# Patient Record
Sex: Male | Born: 1960 | Race: White | Hispanic: No | Marital: Married | Smoking: Never smoker
Health system: Southern US, Community
[De-identification: ages and names within clinical notes are randomized; demographics above are authoritative.]

## PROBLEM LIST (undated history)

## (undated) DIAGNOSIS — Z86010 Personal history of colonic polyps: Secondary | ICD-10-CM

## (undated) DIAGNOSIS — T7840XA Allergy, unspecified, initial encounter: Secondary | ICD-10-CM

## (undated) DIAGNOSIS — M65342 Trigger finger, left ring finger: Secondary | ICD-10-CM

## (undated) DIAGNOSIS — F988 Other specified behavioral and emotional disorders with onset usually occurring in childhood and adolescence: Secondary | ICD-10-CM

## (undated) DIAGNOSIS — F329 Major depressive disorder, single episode, unspecified: Secondary | ICD-10-CM

## (undated) DIAGNOSIS — M1990 Unspecified osteoarthritis, unspecified site: Secondary | ICD-10-CM

## (undated) DIAGNOSIS — Z87442 Personal history of urinary calculi: Secondary | ICD-10-CM

## (undated) DIAGNOSIS — J45909 Unspecified asthma, uncomplicated: Secondary | ICD-10-CM

## (undated) DIAGNOSIS — Z98811 Dental restoration status: Secondary | ICD-10-CM

## (undated) DIAGNOSIS — J302 Other seasonal allergic rhinitis: Secondary | ICD-10-CM

## (undated) DIAGNOSIS — I10 Essential (primary) hypertension: Secondary | ICD-10-CM

## (undated) DIAGNOSIS — M2560 Stiffness of unspecified joint, not elsewhere classified: Secondary | ICD-10-CM

## (undated) DIAGNOSIS — Z8709 Personal history of other diseases of the respiratory system: Secondary | ICD-10-CM

## (undated) DIAGNOSIS — F32A Depression, unspecified: Secondary | ICD-10-CM

## (undated) DIAGNOSIS — Z8719 Personal history of other diseases of the digestive system: Secondary | ICD-10-CM

## (undated) HISTORY — DX: Major depressive disorder, single episode, unspecified: F32.9

## (undated) HISTORY — DX: Unspecified osteoarthritis, unspecified site: M19.90

## (undated) HISTORY — DX: Depression, unspecified: F32.A

## (undated) HISTORY — DX: Other specified behavioral and emotional disorders with onset usually occurring in childhood and adolescence: F98.8

## (undated) HISTORY — PX: PHOTOREFRACTIVE KERATOTOMY: SHX216

## (undated) HISTORY — DX: Personal history of colonic polyps: Z86.010

## (undated) HISTORY — PX: CHOLECYSTECTOMY: SHX55

## (undated) HISTORY — DX: Essential (primary) hypertension: I10

## (undated) HISTORY — DX: Unspecified asthma, uncomplicated: J45.909

## (undated) HISTORY — DX: Allergy, unspecified, initial encounter: T78.40XA

---

## 2010-08-14 ENCOUNTER — Emergency Department (HOSPITAL_COMMUNITY): Admission: EM | Admit: 2010-08-14 | Discharge: 2010-08-14 | Payer: Self-pay | Admitting: Family Medicine

## 2010-11-08 DIAGNOSIS — M1990 Unspecified osteoarthritis, unspecified site: Secondary | ICD-10-CM

## 2010-11-08 HISTORY — DX: Unspecified osteoarthritis, unspecified site: M19.90

## 2011-01-22 ENCOUNTER — Ambulatory Visit
Admission: RE | Admit: 2011-01-22 | Discharge: 2011-01-22 | Disposition: A | Discharge status: Home / Self Care | Payer: 59 | Source: Ambulatory Visit | Attending: Allergy | Admitting: Allergy

## 2011-01-22 ENCOUNTER — Other Ambulatory Visit: Payer: Self-pay | Admitting: Allergy

## 2011-01-22 DIAGNOSIS — J329 Chronic sinusitis, unspecified: Secondary | ICD-10-CM

## 2011-02-19 ENCOUNTER — Encounter (HOSPITAL_BASED_OUTPATIENT_CLINIC_OR_DEPARTMENT_OTHER)
Admission: RE | Admit: 2011-02-19 | Discharge: 2011-02-19 | Disposition: A | Discharge status: Home / Self Care | Payer: 59 | Source: Ambulatory Visit | Attending: Orthopedic Surgery | Admitting: Orthopedic Surgery

## 2011-02-22 ENCOUNTER — Ambulatory Visit (HOSPITAL_BASED_OUTPATIENT_CLINIC_OR_DEPARTMENT_OTHER)
Admission: RE | Admit: 2011-02-22 | Discharge: 2011-02-22 | Disposition: A | Discharge status: Home / Self Care | Payer: 59 | Source: Ambulatory Visit | Attending: Orthopedic Surgery | Admitting: Orthopedic Surgery

## 2011-02-22 DIAGNOSIS — J45909 Unspecified asthma, uncomplicated: Secondary | ICD-10-CM | POA: Insufficient documentation

## 2011-02-22 DIAGNOSIS — Z01810 Encounter for preprocedural cardiovascular examination: Secondary | ICD-10-CM | POA: Insufficient documentation

## 2011-02-22 DIAGNOSIS — F329 Major depressive disorder, single episode, unspecified: Secondary | ICD-10-CM | POA: Insufficient documentation

## 2011-02-22 DIAGNOSIS — M6530 Trigger finger, unspecified finger: Secondary | ICD-10-CM | POA: Insufficient documentation

## 2011-02-22 DIAGNOSIS — Z01812 Encounter for preprocedural laboratory examination: Secondary | ICD-10-CM | POA: Insufficient documentation

## 2011-02-22 DIAGNOSIS — I10 Essential (primary) hypertension: Secondary | ICD-10-CM | POA: Insufficient documentation

## 2011-02-22 DIAGNOSIS — G479 Sleep disorder, unspecified: Secondary | ICD-10-CM | POA: Insufficient documentation

## 2011-02-22 DIAGNOSIS — Z79899 Other long term (current) drug therapy: Secondary | ICD-10-CM | POA: Insufficient documentation

## 2011-02-22 DIAGNOSIS — R51 Headache: Secondary | ICD-10-CM | POA: Insufficient documentation

## 2011-02-22 DIAGNOSIS — F3289 Other specified depressive episodes: Secondary | ICD-10-CM | POA: Insufficient documentation

## 2011-02-22 HISTORY — PX: TRIGGER FINGER RELEASE: SHX641

## 2011-02-25 NOTE — Op Note (Signed)
Christopher Bolton, Christopher Bolton             ACCOUNT NO.:  [id number]  MEDICAL RECORD NO.:  [id number]           PATIENT TYPE:  LOCATION:                                 FACILITY:  PHYSICIAN:  Betha Loa, MD             DATE OF BIRTH:  DATE OF PROCEDURE:  02/22/2011 DATE OF DISCHARGE:                              OPERATIVE REPORT   PREOPERATIVE DIAGNOSIS:  Right long finger trigger digit.  POSTOPERATIVE DIAGNOSIS:  Right long finger trigger digit.  PROCEDURE:  Release of A1 pulley, right long finger.  SURGEON:  Betha Loa, MD.  ASSISTANT:  None.  ANESTHESIA:  Local with sedation.  IV FLUIDS:  Per anesthesia flow sheet.  ESTIMATED BLOOD LOSS:  Minimal.  COMPLICATIONS:  None.  SPECIMENS:  None.  TOURNIQUET TIME:  25 minutes.  DISPOSITION:  Stable to PACU.  INDICATIONS:  Christopher Bolton is a 50 year old male who had been following in the office for right long finger trigger digit.  We have injected this three times over the past year.  He has had relief with the injections, but the triggering continues to recur.  He most recently noted it start to catch in the morning.  He returned to the office and we discussed release of the A1 pulley.  Risks, benefits, and alternatives of surgery were discussed including the risk of blood loss, infection, damage to nerves, vessels, tendons, ligaments bone, fair surgery, need for additional surgery, complications with wound healing, continued pain, continued triggering, and need for repeat release.  They voiced these risks and elected to proceed.  OPERATIVE COURSE:  After being identified preoperatively by myself, the patient and I agreed upon the procedure and site of procedure.  The surgical site was marked.  The risks, benefits, and alternatives of surgery were reviewed and he wished to proceed.  Surgical consent was signed.  He was given 1 gram of IV Ancef as preoperative antibiotic prophylaxis.  He was transported to the operating room,  placed on the operating table in supine position with the right upper extremity on armboard.  Sedation was administered by the anesthesiologist.  The right upper extremity was prepped and draped in normal sterile orthopedic fashion.  Surgical pause was performed between surgeons, Anesthesia, and operating room staff, and all were in agreement as to the patient, procedure, and site of procedure.  The area of the A1 pulley was injected with a half-and-half solution of 1% plain lidocaine and 0.25% plain Marcaine.  Tourniquet at the proximal aspect of the forearm was inflated to 250 mmHg after exsanguination of limb with an Esmarch bandage.  Incision was made in oblique fashion between the proximal and distal palmar creases.  This was carried into subcutaneous tissues by spreading technique.  Care was taken to protect the neurovascular bundles on both the radial and ulnar sides.  The flexor tendon sheath was identified.  The A1 pulley was incised its entirety.  Good division between the A1 and A2 pulley was seen.  There was some tight bands oftissue more proximally and these were divided as well.  The tendons were expressed through the  wound and separated.  The patient was awoken from his sedation.  He was asked to make a tight fist, which he was able to do, and then slowly extend the fingers and no catching was noted.  He was able to fully flex and fully extend the digits.  The wound was copiously irrigated with 500 mL of sterile saline.  It was then closed with 4-0 nylon in a horizontal mattress fashion.  The wound was dressed with sterile Xeroform, 4x4s, and wrapped with Kling and Coban dressing. The tourniquet was deflated at 25 minutes.  The operative drapes were broken down.  The patient was awoken from anesthesia safely.  He was transferred back to stretcher and taken to PACU in stable condition.  I will see him back in the office in 1 week.  I will give him Percocet 5/325 one to two  p.o. q.6 h. p.r.n. pain, dispensed #40.     Betha Loa, MD     KK/MEDQ  D:  02/22/2011  T:  02/22/2011  Job:  [id number]  Electronically Signed by Betha Loa  on 02/25/2011 05:58:26 PM

## 2011-09-13 ENCOUNTER — Other Ambulatory Visit: Payer: Self-pay | Admitting: Emergency Medicine

## 2011-09-13 DIAGNOSIS — M542 Cervicalgia: Secondary | ICD-10-CM

## 2011-09-16 ENCOUNTER — Ambulatory Visit
Admission: RE | Admit: 2011-09-16 | Discharge: 2011-09-16 | Disposition: A | Discharge status: Home / Self Care | Payer: 59 | Source: Ambulatory Visit | Attending: Emergency Medicine | Admitting: Emergency Medicine

## 2011-09-16 DIAGNOSIS — M542 Cervicalgia: Secondary | ICD-10-CM

## 2011-10-21 ENCOUNTER — Ambulatory Visit (INDEPENDENT_AMBULATORY_CARE_PROVIDER_SITE_OTHER): Payer: 59

## 2011-10-21 DIAGNOSIS — M5030 Other cervical disc degeneration, unspecified cervical region: Secondary | ICD-10-CM

## 2012-01-26 ENCOUNTER — Ambulatory Visit (INDEPENDENT_AMBULATORY_CARE_PROVIDER_SITE_OTHER): Payer: 59 | Admitting: Emergency Medicine

## 2012-01-26 VITALS — BP 150/93 | HR 74 | Temp 98.6°F | Resp 18 | Ht 67.5 in | Wt 219.0 lb

## 2012-01-26 DIAGNOSIS — Z9109 Other allergy status, other than to drugs and biological substances: Secondary | ICD-10-CM

## 2012-01-26 DIAGNOSIS — J309 Allergic rhinitis, unspecified: Secondary | ICD-10-CM

## 2012-01-26 DIAGNOSIS — M542 Cervicalgia: Secondary | ICD-10-CM

## 2012-01-26 MED ORDER — GABAPENTIN 100 MG PO CAPS: ORAL_CAPSULE | ORAL | Status: DC

## 2012-01-26 MED ORDER — MONTELUKAST SODIUM 10 MG PO TABS: 10.0000 mg | ORAL_TABLET | Freq: Every day | ORAL | Status: DC

## 2012-01-26 MED ORDER — HYDROCODONE-ACETAMINOPHEN 5-325 MG PO TABS: 1.0000 | ORAL_TABLET | Freq: Four times a day (QID) | ORAL | Status: AC | PRN

## 2012-01-26 MED ORDER — ALBUTEROL SULFATE HFA 108 (90 BASE) MCG/ACT IN AERS: 2.0000 | INHALATION_SPRAY | RESPIRATORY_TRACT | Status: DC | PRN

## 2012-01-26 MED ORDER — METHYLPREDNISOLONE ACETATE 80 MG/ML IJ SUSP
80.0000 mg | Freq: Once | INTRAMUSCULAR | Status: AC
  Administered 2012-01-26: 80 mg via INTRAMUSCULAR

## 2012-01-26 MED ORDER — LEVOCETIRIZINE DIHYDROCHLORIDE 5 MG PO TABS: 5.0000 mg | ORAL_TABLET | Freq: Every evening | ORAL | Status: DC

## 2012-01-26 NOTE — Progress Notes (Signed)
  Subjective:    Patient ID: Christopher Bolton, male    DOB: [birth year], 51 y.o.   MRN: [id number]  HPI patient enters for followup of his cervical disease. His main level of problem and C5-C6 where he has significant foraminal stenosis which is causing some narrowing of the spinal canal. He's been seeing Dr. Melvyn Neth a chiropractor and receiving cervical traction. Does seem to help some. Pain and requires hydrocodone as also been on gabapentin and taking that off and on at night. Because it gives him bad dreams didn't promise of seasonal allergies. He is to see his doctor in detail and get a shot of Depo-Medrol every month. I told her that that was not a policy was followed in our office. At [facility] to get a shot    Review of Systems  Constitutional: Negative.   HENT: Positive for congestion, rhinorrhea, sneezing and postnasal drip.   Eyes: Positive for itching.  Respiratory: Positive for wheezing.   Gastrointestinal: Negative.   Genitourinary: Negative.   Musculoskeletal: Positive for back pain.       Persistent having a lot of discomfort in his neck. His low pain into shoulders and both arms. He has no numbness in his hands.  Skin: Negative.        Objective:   Physical Exam HEENT exam TMs are clear the nose is congested throat is normal neck supple chest no wheezes were heard T10 reflexes of the upper extremities are 1+. Motor strength was 5 out of 5 all muscle groups in the arms. He has very limited range of motion of his neck. He does have pain with axial loading.        Assessment & Plan:  Patient has significant cervical disc disease. He also has seasonal allergic rhinitis. Plan refill his medications he normally takes for this. He will continue on Neurontin at night. I did give him a limited supply of hydrocodone for severe pain. He is still not interested in a neurosurgical opinion about his neck.

## 2012-08-01 ENCOUNTER — Other Ambulatory Visit: Payer: Self-pay | Admitting: Family Medicine

## 2012-10-02 ENCOUNTER — Ambulatory Visit (INDEPENDENT_AMBULATORY_CARE_PROVIDER_SITE_OTHER): Payer: 59 | Admitting: Physician Assistant

## 2012-10-02 VITALS — BP 157/98 | HR 86 | Temp 98.0°F | Resp 18 | Ht 68.0 in | Wt 221.0 lb

## 2012-10-02 DIAGNOSIS — Z131 Encounter for screening for diabetes mellitus: Secondary | ICD-10-CM

## 2012-10-02 DIAGNOSIS — J329 Chronic sinusitis, unspecified: Secondary | ICD-10-CM

## 2012-10-02 DIAGNOSIS — I10 Essential (primary) hypertension: Secondary | ICD-10-CM

## 2012-10-02 LAB — GLUCOSE, POCT (MANUAL RESULT ENTRY): POC Glucose: 51 mg/dl — AB (ref 70–99)

## 2012-10-02 MED ORDER — PREDNISONE 20 MG PO TABS: ORAL_TABLET | ORAL | Status: DC

## 2012-10-02 MED ORDER — AZITHROMYCIN 250 MG PO TABS: ORAL_TABLET | ORAL | Status: DC

## 2012-10-02 MED ORDER — LOSARTAN POTASSIUM 50 MG PO TABS: 50.0000 mg | ORAL_TABLET | Freq: Every day | ORAL | Status: DC

## 2012-10-02 MED ORDER — FLUTICASONE PROPIONATE 50 MCG/ACT NA SUSP: 2.0000 | Freq: Every day | NASAL | Status: DC

## 2012-10-02 NOTE — Progress Notes (Signed)
  Subjective:    Patient ID: Christopher Bolton, male    DOB: 08/12/1961, 51 y.o.   MRN: [id number]  HPI 51 year old male presents with 4 day history of sinus pressure/pain, left sided otalgia, congestion, and sore throat. History of sinus infections treated with azithromycin and sometimes prednisone . Does have seasonal allergies in the Spring treated with Xyzal and Singulair.  States he never has problems with allergies this time of year. Denies fever, chills, nausea, vomiting, dizziness, or cough.  Also has been on Cozaar for hypertension which he has been out of due to needing an office visit.  It is slightly elevated today.  He has tolerated it well.      Review of Systems  Constitutional: Negative for fever and chills.  HENT: Positive for ear pain (left side), congestion, sore throat, postnasal drip and sinus pressure. Negative for rhinorrhea.   Respiratory: Negative for cough.   Gastrointestinal: Negative for nausea and vomiting.  All other systems reviewed and are negative.       Objective:   Physical Exam  Constitutional: He is oriented to person, place, and time. He appears well-developed and well-nourished.  HENT:  Head: Normocephalic and atraumatic.  Right Ear: Hearing, tympanic membrane, external ear and ear canal normal.  Left Ear: Hearing, external ear and ear canal normal. Tympanic membrane is erythematous.  Nose: Right sinus exhibits maxillary sinus tenderness. Right sinus exhibits no frontal sinus tenderness. Left sinus exhibits maxillary sinus tenderness. Left sinus exhibits no frontal sinus tenderness.  Mouth/Throat: Oropharynx is clear and moist. No oropharyngeal exudate.  Eyes: Conjunctivae normal are normal.  Neck: Normal range of motion.  Cardiovascular: Normal rate, regular rhythm and normal heart sounds.   Pulmonary/Chest: Effort normal and breath sounds normal.  Lymphadenopathy:    He has no cervical adenopathy.  Neurological: He is alert and oriented to person,  place, and time.  Psychiatric: He has a normal mood and affect. His behavior is normal. Judgment and thought content normal.    Results for orders placed in visit on 10/02/12  GLUCOSE, POCT (MANUAL RESULT ENTRY)      Component Value Range   POC Glucose 51 (*) 70 - 99 mg/dl         Assessment & Plan:   1. Sinusitis  azithromycin (ZITHROMAX) 250 MG tablet, fluticasone (FLONASE) 50 MCG/ACT nasal spray  2. Screening for diabetes mellitus  POCT glucose (manual entry)  3. Hypertension  Comprehensive metabolic panel   Zpack use as directed Increase fluids Flonase bid Follow up if symptoms worsen or fail to improve Refilled Cozaar #90 with plans for him to see Dr. Cleta Alberts for CPE before this runs out. I have told him that I would rather refill this x 1 then for him to run out.  He understands this and plans to come in the next 3 months for a physical.

## 2012-10-03 ENCOUNTER — Encounter: Payer: Self-pay | Admitting: Physician Assistant

## 2012-10-03 LAB — COMPREHENSIVE METABOLIC PANEL
ALT: 22 U/L (ref 0–53)
AST: 16 U/L (ref 0–37)
Albumin: 4.8 g/dL (ref 3.5–5.2)
Alkaline Phosphatase: 99 U/L (ref 39–117)
BUN: 9 mg/dL (ref 6–23)
CO2: 28 mEq/L (ref 19–32)
Calcium: 9.3 mg/dL (ref 8.4–10.5)
Chloride: 104 mEq/L (ref 96–112)
Creat: 0.99 mg/dL (ref 0.50–1.35)
Glucose, Bld: 54 mg/dL — ABNORMAL LOW (ref 70–99)
Potassium: 4.1 mEq/L (ref 3.5–5.3)
Sodium: 143 mEq/L (ref 135–145)
Total Bilirubin: 1 mg/dL (ref 0.3–1.2)
Total Protein: 6.8 g/dL (ref 6.0–8.3)

## 2012-10-04 ENCOUNTER — Ambulatory Visit: Payer: 59

## 2012-10-04 ENCOUNTER — Ambulatory Visit (INDEPENDENT_AMBULATORY_CARE_PROVIDER_SITE_OTHER): Payer: 59 | Admitting: Family Medicine

## 2012-10-04 VITALS — BP 162/100 | HR 74 | Temp 97.6°F | Resp 18 | Ht 68.0 in | Wt 221.4 lb

## 2012-10-04 DIAGNOSIS — R739 Hyperglycemia, unspecified: Secondary | ICD-10-CM

## 2012-10-04 DIAGNOSIS — R81 Glycosuria: Secondary | ICD-10-CM

## 2012-10-04 DIAGNOSIS — R110 Nausea: Secondary | ICD-10-CM

## 2012-10-04 DIAGNOSIS — R198 Other specified symptoms and signs involving the digestive system and abdomen: Secondary | ICD-10-CM

## 2012-10-04 DIAGNOSIS — R109 Unspecified abdominal pain: Secondary | ICD-10-CM

## 2012-10-04 DIAGNOSIS — R7309 Other abnormal glucose: Secondary | ICD-10-CM

## 2012-10-04 DIAGNOSIS — N201 Calculus of ureter: Secondary | ICD-10-CM

## 2012-10-04 LAB — POCT UA - MICROSCOPIC ONLY
Mucus, UA: NEGATIVE
Yeast, UA: NEGATIVE

## 2012-10-04 LAB — POCT URINALYSIS DIPSTICK
Bilirubin, UA: NEGATIVE
Glucose, UA: 500
Ketones, UA: NEGATIVE
Leukocytes, UA: NEGATIVE
Nitrite, UA: NEGATIVE

## 2012-10-04 MED ORDER — TAMSULOSIN HCL 0.4 MG PO CAPS: 0.4000 mg | ORAL_CAPSULE | Freq: Every day | ORAL | Status: DC

## 2012-10-04 MED ORDER — OXYCODONE-ACETAMINOPHEN 7.5-325 MG PO TABS: 1.0000 | ORAL_TABLET | ORAL | Status: DC | PRN

## 2012-10-04 MED ORDER — ONDANSETRON 8 MG PO TBDP: 8.0000 mg | ORAL_TABLET | Freq: Three times a day (TID) | ORAL | Status: DC | PRN

## 2012-10-04 MED ORDER — KETOROLAC TROMETHAMINE 60 MG/2ML IM SOLN
60.0000 mg | Freq: Once | INTRAMUSCULAR | Status: AC
  Administered 2012-10-04: 60 mg via INTRAMUSCULAR

## 2012-10-04 MED ORDER — ONDANSETRON 4 MG PO TBDP: 4.0000 mg | ORAL_TABLET | Freq: Three times a day (TID) | ORAL | Status: DC | PRN

## 2012-10-04 MED ORDER — ONDANSETRON 4 MG PO TBDP: 8.0000 mg | ORAL_TABLET | Freq: Once | ORAL | Status: DC

## 2012-10-04 NOTE — Patient Instructions (Addendum)
Drink plenty of fluids  Strain urine  Take pain pills when needed every 4 hours for the pain  Take the Flomax every night to try and relax the ureter to help to pass the stone  If the stone does not pass over the next 5 days return for reevaluation

## 2012-10-04 NOTE — Progress Notes (Signed)
Subjective: Patient has had some pain in his back last couple of days, but today had acute severe left flank pain. He has had intermittent discoloration of his urine making think he has had some hematuria. He was just here 2 days ago for treating his sinuses. He has had kidney stones in the past, and is fairly sure this is a kidney stone with severe left flank pain  Objective: Very tender to percussion on the left CVA region. Left abdomen is tender in the left lower quadrant mid abdomen area.   Results for orders placed in visit on 10/04/12  POCT UA - MICROSCOPIC ONLY      Component Value Range   WBC, Ur, HPF, POC 0-1     RBC, urine, microscopic 0-10     Bacteria, U Microscopic neg     Mucus, UA neg     Epithelial cells, urine per micros 0-3     Crystals, Ur, HPF, POC neg     Casts, Ur, LPF, POC calcium oxalate     Yeast, UA neg    POCT URINALYSIS DIPSTICK      Component Value Range   Color, UA yellow     Clarity, UA clear     Glucose, UA 500     Bilirubin, UA neg     Ketones, UA neg     Spec Grav, UA 1.020     Blood, UA large     pH, UA 7.0     Protein, UA neg     Urobilinogen, UA 0.2     Nitrite, UA neg     Leukocytes, UA Negative    GLUCOSE, POCT (MANUAL RESULT ENTRY)      Component Value Range   POC Glucose 208 (*) 70 - 99 mg/dl   [facility] reading (PRIMARY) by  Dr. Alwyn Ren Problem left ureterolithiasis at about the lumbar 3-4 interspace area.  Assessment: Left ureterolithiasis and flank pain  Plan: Toradol and Zofran given in the office. Percocet 7.5/325 #25 one to 2 every 4 when necessary for pain  Return if he does not pass the stone. He is to strain his urine.

## 2012-10-08 ENCOUNTER — Emergency Department (HOSPITAL_COMMUNITY): Payer: 59

## 2012-10-08 ENCOUNTER — Encounter (HOSPITAL_COMMUNITY): Payer: Self-pay | Admitting: Anesthesiology

## 2012-10-08 ENCOUNTER — Encounter (HOSPITAL_COMMUNITY)
Admission: EM | Disposition: A | Discharge status: Home / Self Care | Payer: Self-pay | Source: Home / Self Care | Attending: Emergency Medicine

## 2012-10-08 ENCOUNTER — Observation Stay (HOSPITAL_COMMUNITY): Payer: 59 | Admitting: Anesthesiology

## 2012-10-08 ENCOUNTER — Encounter (HOSPITAL_COMMUNITY): Payer: Self-pay | Admitting: Emergency Medicine

## 2012-10-08 ENCOUNTER — Observation Stay (HOSPITAL_COMMUNITY)
Admission: EM | Admit: 2012-10-08 | Discharge: 2012-10-09 | Disposition: A | Discharge status: Home / Self Care | Payer: 59 | Attending: Urology | Admitting: Urology

## 2012-10-08 DIAGNOSIS — N132 Hydronephrosis with renal and ureteral calculous obstruction: Secondary | ICD-10-CM

## 2012-10-08 DIAGNOSIS — I10 Essential (primary) hypertension: Secondary | ICD-10-CM | POA: Insufficient documentation

## 2012-10-08 DIAGNOSIS — N201 Calculus of ureter: Principal | ICD-10-CM | POA: Insufficient documentation

## 2012-10-08 DIAGNOSIS — N289 Disorder of kidney and ureter, unspecified: Secondary | ICD-10-CM

## 2012-10-08 DIAGNOSIS — N179 Acute kidney failure, unspecified: Secondary | ICD-10-CM | POA: Insufficient documentation

## 2012-10-08 HISTORY — PX: CYSTOSCOPY/RETROGRADE/URETEROSCOPY: SHX5316

## 2012-10-08 LAB — BASIC METABOLIC PANEL
BUN: 21 mg/dL (ref 6–23)
Chloride: 95 mEq/L — ABNORMAL LOW (ref 96–112)
GFR calc Af Amer: 55 mL/min — ABNORMAL LOW (ref >90)
GFR calc non Af Amer: 48 mL/min — ABNORMAL LOW (ref >90)
Potassium: 3.9 mEq/L (ref 3.5–5.1)

## 2012-10-08 LAB — URINALYSIS, ROUTINE W REFLEX MICROSCOPIC
Glucose, UA: NEGATIVE mg/dL
Ketones, ur: 15 mg/dL — AB
Leukocytes, UA: NEGATIVE
pH: 5 (ref 5.0–8.0)

## 2012-10-08 LAB — SURGICAL PCR SCREEN
MRSA, PCR: NEGATIVE
Staphylococcus aureus: NEGATIVE

## 2012-10-08 SURGERY — CYSTOSCOPY/RETROGRADE/URETEROSCOPY
Anesthesia: General | Site: Ureter | Laterality: Left | Wound class: Clean Contaminated

## 2012-10-08 MED ORDER — MONTELUKAST SODIUM 10 MG PO TABS
10.0000 mg | ORAL_TABLET | Freq: Every day | ORAL | Status: DC
  Administered 2012-10-09: 10 mg via ORAL
  Filled 2012-10-08 (×2): qty 1

## 2012-10-08 MED ORDER — LIDOCAINE HCL 1 % IJ SOLN
INTRAMUSCULAR | Status: DC | PRN
  Administered 2012-10-08: 60 mg via INTRADERMAL

## 2012-10-08 MED ORDER — KCL IN DEXTROSE-NACL 10-5-0.45 MEQ/L-%-% IV SOLN
INTRAVENOUS | Status: DC
  Administered 2012-10-08 – 2012-10-09 (×2): via INTRAVENOUS
  Filled 2012-10-08 (×2): qty 1000

## 2012-10-08 MED ORDER — LACTATED RINGERS IV SOLN
INTRAVENOUS | Status: DC
  Administered 2012-10-09: via INTRAVENOUS

## 2012-10-08 MED ORDER — IOHEXOL 300 MG/ML  SOLN
INTRAMUSCULAR | Status: DC | PRN
  Administered 2012-10-08: 50 mL

## 2012-10-08 MED ORDER — ACETAMINOPHEN 10 MG/ML IV SOLN
INTRAVENOUS | Status: DC | PRN
  Administered 2012-10-08 (×2): 1000 mg via INTRAVENOUS

## 2012-10-08 MED ORDER — PROPOFOL 10 MG/ML IV EMUL
INTRAVENOUS | Status: DC | PRN
  Administered 2012-10-08: 200 mg via INTRAVENOUS

## 2012-10-08 MED ORDER — OXYCODONE-ACETAMINOPHEN 5-325 MG PO TABS
1.0000 | ORAL_TABLET | ORAL | Status: DC | PRN
  Administered 2012-10-09: 1 via ORAL
  Filled 2012-10-08: qty 1

## 2012-10-08 MED ORDER — HYDROMORPHONE HCL PF 1 MG/ML IJ SOLN
0.5000 mg | INTRAMUSCULAR | Status: DC | PRN
  Administered 2012-10-08: 1 mg via INTRAVENOUS
  Filled 2012-10-08: qty 1

## 2012-10-08 MED ORDER — SODIUM CHLORIDE 0.9 % IV BOLUS (SEPSIS)
1000.0000 mL | Freq: Once | INTRAVENOUS | Status: AC
  Administered 2012-10-08: 1000 mL via INTRAVENOUS

## 2012-10-08 MED ORDER — FENTANYL CITRATE 0.05 MG/ML IJ SOLN
INTRAMUSCULAR | Status: DC | PRN
  Administered 2012-10-08: 50 ug via INTRAVENOUS
  Administered 2012-10-08: 25 ug via INTRAVENOUS

## 2012-10-08 MED ORDER — DOCUSATE SODIUM 100 MG PO CAPS
100.0000 mg | ORAL_CAPSULE | Freq: Two times a day (BID) | ORAL | Status: DC
  Administered 2012-10-09: 100 mg via ORAL
  Filled 2012-10-08 (×3): qty 1

## 2012-10-08 MED ORDER — ONDANSETRON HCL 4 MG/2ML IJ SOLN: 4.0000 mg | INTRAMUSCULAR | Status: DC | PRN

## 2012-10-08 MED ORDER — BUPROPION HCL ER (XL) 300 MG PO TB24
450.0000 mg | ORAL_TABLET | Freq: Every day | ORAL | Status: DC
  Filled 2012-10-08 (×2): qty 1

## 2012-10-08 MED ORDER — HYDROMORPHONE HCL PF 1 MG/ML IJ SOLN
1.0000 mg | Freq: Once | INTRAMUSCULAR | Status: AC
  Administered 2012-10-08: 1 mg via INTRAVENOUS
  Filled 2012-10-08: qty 1

## 2012-10-08 MED ORDER — SENNA 8.6 MG PO TABS
1.0000 | ORAL_TABLET | Freq: Two times a day (BID) | ORAL | Status: DC
  Administered 2012-10-09: 8.6 mg via ORAL
  Filled 2012-10-08: qty 1

## 2012-10-08 MED ORDER — CEFAZOLIN SODIUM-DEXTROSE 2-3 GM-% IV SOLR
INTRAVENOUS | Status: DC | PRN
  Administered 2012-10-08: 2 g via INTRAVENOUS

## 2012-10-08 MED ORDER — TAMSULOSIN HCL 0.4 MG PO CAPS
0.4000 mg | ORAL_CAPSULE | Freq: Every day | ORAL | Status: DC
  Filled 2012-10-08: qty 1

## 2012-10-08 MED ORDER — SENNA-DOCUSATE SODIUM 8.6-50 MG PO TABS: 1.0000 | ORAL_TABLET | Freq: Two times a day (BID) | ORAL | Status: DC

## 2012-10-08 MED ORDER — HYDROMORPHONE HCL PF 1 MG/ML IJ SOLN: 0.2500 mg | INTRAMUSCULAR | Status: DC | PRN

## 2012-10-08 MED ORDER — SODIUM CHLORIDE 0.9 % IR SOLN
Status: DC | PRN
  Administered 2012-10-08: 4000 mL

## 2012-10-08 MED ORDER — CEFAZOLIN SODIUM-DEXTROSE 2-3 GM-% IV SOLR
INTRAVENOUS | Status: AC
  Filled 2012-10-08: qty 50

## 2012-10-08 MED ORDER — OXYBUTYNIN CHLORIDE 5 MG PO TABS: 5.0000 mg | ORAL_TABLET | Freq: Three times a day (TID) | ORAL | Status: DC | PRN

## 2012-10-08 MED ORDER — OXYCODONE-ACETAMINOPHEN 5-325 MG PO TABS: 1.0000 | ORAL_TABLET | ORAL | Status: DC | PRN

## 2012-10-08 MED ORDER — TAMSULOSIN HCL 0.4 MG PO CAPS: 0.4000 mg | ORAL_CAPSULE | Freq: Every day | ORAL | Status: DC

## 2012-10-08 MED ORDER — ONDANSETRON HCL 4 MG/2ML IJ SOLN
4.0000 mg | Freq: Once | INTRAMUSCULAR | Status: AC
  Administered 2012-10-08: 4 mg via INTRAVENOUS
  Filled 2012-10-08: qty 2

## 2012-10-08 MED ORDER — LACTATED RINGERS IV SOLN
INTRAVENOUS | Status: DC | PRN
  Administered 2012-10-08: 21:00:00 via INTRAVENOUS

## 2012-10-08 MED ORDER — ACETAMINOPHEN 10 MG/ML IV SOLN
INTRAVENOUS | Status: AC
  Filled 2012-10-08: qty 100

## 2012-10-08 MED ORDER — IOHEXOL 300 MG/ML  SOLN
INTRAMUSCULAR | Status: AC
  Filled 2012-10-08: qty 1

## 2012-10-08 MED ORDER — ALBUTEROL SULFATE HFA 108 (90 BASE) MCG/ACT IN AERS: 2.0000 | INHALATION_SPRAY | RESPIRATORY_TRACT | Status: DC | PRN

## 2012-10-08 MED ORDER — PROMETHAZINE HCL 25 MG/ML IJ SOLN: 6.2500 mg | INTRAMUSCULAR | Status: DC | PRN

## 2012-10-08 MED ORDER — ONDANSETRON HCL 4 MG/2ML IJ SOLN
INTRAMUSCULAR | Status: DC | PRN
  Administered 2012-10-08: 4 mg via INTRAVENOUS

## 2012-10-08 MED ORDER — 0.9 % SODIUM CHLORIDE (POUR BTL) OPTIME
TOPICAL | Status: DC | PRN
  Administered 2012-10-08: 1000 mL

## 2012-10-08 SURGICAL SUPPLY — 17 items
ADAPTER CATH URET PLST 4-6FR (CATHETERS) ×3 IMPLANT
BAG URO CATCHER STRL LF (DRAPE) ×3 IMPLANT
BASKET LASER NITINOL 1.9FR (BASKET) IMPLANT
CATH INTERMIT  6FR 70CM (CATHETERS) IMPLANT
CLOTH BEACON ORANGE TIMEOUT ST (SAFETY) ×3 IMPLANT
DRAPE CAMERA CLOSED 9X96 (DRAPES) ×3 IMPLANT
GLOVE BIOGEL M STRL SZ7.5 (GLOVE) ×3 IMPLANT
GOWN PREVENTION PLUS XLARGE (GOWN DISPOSABLE) ×3 IMPLANT
GOWN STRL NON-REIN LRG LVL3 (GOWN DISPOSABLE) ×3 IMPLANT
GUIDEWIRE ANG ZIPWIRE 038X150 (WIRE) ×3 IMPLANT
GUIDEWIRE STR DUAL SENSOR (WIRE) ×3 IMPLANT
MANIFOLD NEPTUNE II (INSTRUMENTS) ×3 IMPLANT
PACK CYSTO (CUSTOM PROCEDURE TRAY) ×3 IMPLANT
STENT CONTOUR URETERAL (STENTS) ×3 IMPLANT
SYR CONTROL 10ML LL (SYRINGE) IMPLANT
TUBE FEEDING 8FR 16IN STR KANG (MISCELLANEOUS) IMPLANT
TUBING CONNECTING 10 (TUBING) ×3 IMPLANT

## 2012-10-08 NOTE — ED Provider Notes (Signed)
History     CSN: [id number]  Arrival date & time 10/08/12  1130   First MD Initiated Contact with Patient 10/08/12 1252      Chief Complaint  Patient presents with  . Flank Pain    (Consider location/radiation/quality/duration/timing/severity/associated sxs/prior treatment) HPI Comments: Patient presents with complaint of left flank pain that has been persistent for the past 4 days. Patient was diagnosed with 5 mm left ureteral stone on KUB at that time. Patient has history of kidney stones and states this feels like his previous. Acute onset of pain, severe, associated with extreme nausea. Patient was sent home with pain medication, nausea medication, and Flomax. Patient came in for evaluation today because the symptoms are persistent. No fever, vomiting. No hematuria or dysuria. No other abdominal pain, chest pain, shortness of breath. Onset of symptoms acute. Course is persistent. Nothing makes symptoms worse. Pain medicine does help. He does not have a urologist. No h/o renal stents, lithotripsy.   The history is provided by the patient.    Past Medical History  Diagnosis Date  . Allergy   . Depression   . Asthma   . Hypertension     Past Surgical History  Procedure Date  . Eye surgery   . Gallbladder surgery 2002  . Cholecystectomy     Family History  Problem Relation Age of Onset  . Diabetes Mother     History  Substance Use Topics  . Smoking status: Never Smoker   . Smokeless tobacco: Never Used  . Alcohol Use: Yes     Comment: occasionally      Review of Systems  Constitutional: Negative for fever.  HENT: Negative for sore throat and rhinorrhea.   Eyes: Negative for redness.  Respiratory: Negative for cough.   Cardiovascular: Negative for chest pain.  Gastrointestinal: Positive for nausea. Negative for vomiting, abdominal pain and diarrhea.  Genitourinary: Positive for flank pain. Negative for dysuria, hematuria and difficulty urinating.    Musculoskeletal: Negative for myalgias.  Skin: Negative for rash.  Neurological: Negative for headaches.    Allergies  Review of patient's allergies indicates no known allergies.  Home Medications   Current Outpatient Rx  Name  Route  Sig  Dispense  Refill  . ALBUTEROL SULFATE HFA 108 (90 BASE) MCG/ACT IN AERS   Inhalation   Inhale 2 puffs into the lungs every 4 (four) hours as needed.         . AMPHETAMINE-DEXTROAMPHET ER 10 MG PO CP24   Oral   Take 10 mg by mouth every morning. Pt is on a  Staggered dosage.  Taking 5 tablets beginning on Monday, tapering down 1 tablet daily         . AZITHROMYCIN 250 MG PO TABS      Take 2 tabs PO x 1 dose, then 1 tab PO QD x 4 days   6 tablet   0   . BUPROPION HCL ER (XL) 150 MG PO TB24   Oral   Take 450 mg by mouth daily.         Marland Kitchen FLUTICASONE PROPIONATE 50 MCG/ACT NA SUSP   Nasal   Place 2 sprays into the nose daily.   16 g   0   . LEVOCETIRIZINE DIHYDROCHLORIDE 5 MG PO TABS   Oral   Take 1 tablet (5 mg total) by mouth every evening.   30 tablet   11   . LOSARTAN POTASSIUM 50 MG PO TABS   Oral   Take  1 tablet (50 mg total) by mouth daily.   90 tablet   0   . MONTELUKAST SODIUM 10 MG PO TABS   Oral   Take 1 tablet (10 mg total) by mouth at bedtime.   30 tablet   11   . NAPROXEN SODIUM 220 MG PO TABS   Oral   Take 220 mg by mouth 2 (two) times daily with a meal.         . ONDANSETRON 4 MG PO TBDP   Oral   Take 1 tablet (4 mg total) by mouth every 8 (eight) hours as needed for nausea.   20 tablet   0   . OXYCODONE-ACETAMINOPHEN 7.5-325 MG PO TABS   Oral   Take 1 tablet by mouth every 4 (four) hours as needed for pain.   25 tablet   0   . PREDNISONE 20 MG PO TABS      Take 3 PO QAM x3days, 2 PO QAM x3days, 1 PO QAM x3days   18 tablet   0   . TAMSULOSIN HCL 0.4 MG PO CAPS   Oral   Take 1 capsule (0.4 mg total) by mouth daily.   15 capsule   0     BP 144/97  Pulse 92  Temp 98.8 F (37.1  C) (Oral)  Resp 16  SpO2 97%  Physical Exam  Nursing note and vitals reviewed. Constitutional: He appears well-developed and well-nourished. No distress.  HENT:  Head: Normocephalic and atraumatic.  Eyes: Conjunctivae normal are normal. Right eye exhibits no discharge. Left eye exhibits no discharge.  Neck: Normal range of motion. Neck supple.  Cardiovascular: Normal rate, regular rhythm and normal heart sounds.   Pulmonary/Chest: Effort normal and breath sounds normal.  Abdominal: Soft. Bowel sounds are normal. He exhibits no distension. There is no tenderness. There is CVA tenderness (mild left). There is no rebound and no guarding.  Neurological: He is alert.  Skin: Skin is warm and dry.  Psychiatric: He has a normal mood and affect.    ED Course  Procedures (including critical care time)  Labs Reviewed  URINALYSIS, ROUTINE W REFLEX MICROSCOPIC - Abnormal; Notable for the following:    Ketones, ur 15 (*)     All other components within normal limits  BASIC METABOLIC PANEL - Abnormal; Notable for the following:    Sodium 134 (*)     Chloride 95 (*)     Glucose, Bld 110 (*)     Creatinine, Ser 1.62 (*)     GFR calc non Af Amer 48 (*)     GFR calc Af Amer 55 (*)     All other components within normal limits   Ct Abdomen Pelvis Wo Contrast  10/08/2012  *RADIOLOGY REPORT*  Clinical Data: Left flank pain.  Nausea.  CT ABDOMEN AND PELVIS WITHOUT CONTRAST  Technique:  Multidetector CT imaging of the abdomen and pelvis was performed following the standard protocol without intravenous contrast.  Comparison: Abdominal radiographs dated 10/08/2012  Findings: There is a 4 mm stone in the mid left ureter at the level of the left transverse process of L4 causing moderate left hydronephrosis.  No renal calculi.  The liver, spleen, pancreas, and adrenal glands and right kidney are normal.  Gallbladder has been removed.  No dilated bile ducts. Terminal ileum and appendix are normal.  Colon is  normal.  Tiny amount of free fluid in the pelvis.  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  Bilateral foraminal stenosis at that level.  IMPRESSION: 4 mm stone obstructing the mid left ureter.   Original Report Authenticated By: Francene Boyers, M.D.    Dg Abd 1 View  10/08/2012  *RADIOLOGY REPORT*  Clinical Data: Left flank pain. Known kidney stone.  Hematuria.  ABDOMEN - 1 VIEW  Comparison: Abdominal radiographs 10/04/2012 were reviewed  Findings: An approximately 4 mm calcific density is again seen in the left abdomen, near the level of this left spinous process of L4.  This is without significant change in appearance compared to the abdominal radiograph of 10/04/2012.    This could be a left ureteral stone.  No definite stone is seen in the expected location of either renal shadow.  Bowel gas projects over the expected locations of the kidneys, particular on the right.  Cholecystectomy clips.  IMPRESSION: No significant change in approximately 4 mm calcification adjacent to the left transverse process of L4.  A left ureteral stone cannot be excluded.   Original Report Authenticated By: Britta Mccreedy, M.D.      1. Ureteral stone with hydronephrosis   2. Renal insufficiency     1:18 PM Patient seen and examined. Previous x-ray reviewed. Medications ordered. D/w Dr. Rosalia Hammers. Will repeat film.   Vital signs reviewed and are as follows: Filed Vitals:   10/08/12 1141  BP: 144/97  Pulse: 92  Temp: 98.8 F (37.1 C)  Resp: 16   2:52 PM X-ray shows no changes in stone location. Renal fcn from 1.0 to 1.6. I spoke with Dr. Berneice Heinrich of urology to obtain follow-up. He recommends non-CM CT and callback. Ordered. Patient and wife informed.   4:02 PM Dr. Berneice Heinrich informed of CT. He will see patient in ED to formulate plan.   4:08 PM Dr. Berneice Heinrich to admit.    MDM  Ureteral stone with worsening renal function. Urology admit.         Renne Crigler, [place] 10/08/12 [phone]

## 2012-10-08 NOTE — Preoperative (Signed)
Beta Blockers   Reason not to administer Beta Blockers:Not Applicable 

## 2012-10-08 NOTE — Transfer of Care (Signed)
Immediate Anesthesia Transfer of Care Note  Patient: Christopher Bolton  Procedure(s) Performed: Procedure(s) (LRB) with comments: CYSTOSCOPY/RETROGRADE/URETEROSCOPY (Left) - stent   Patient Location: PACU  Anesthesia Type:General  Level of Consciousness: awake, alert , oriented, patient cooperative and responds to stimulation  Airway & Oxygen Therapy: Patient Spontanous Breathing and Patient connected to face mask oxygen  Post-op Assessment: Report given to PACU RN, Post -op Vital signs reviewed and stable and Patient moving all extremities X 4  Post vital signs: Reviewed and stable  Complications: No apparent anesthesia complications

## 2012-10-08 NOTE — ED Notes (Signed)
Pt presents w/ left flank pain, endorses 3 mm kidney stone dx at urgent care center on Wednesday. Pt having nausea w/o emesis; urine is cloudy, frequency and feels like he does not empty his bladder completely.

## 2012-10-08 NOTE — Anesthesia Postprocedure Evaluation (Signed)
Anesthesia Post Note  Patient: Christopher Bolton  Procedure(s) Performed: Procedure(s) (LRB): CYSTOSCOPY/RETROGRADE/URETEROSCOPY (Left)  Anesthesia type: General  Patient location: PACU  Post pain: Pain level controlled  Post assessment: Post-op Vital signs reviewed  Last Vitals:  Filed Vitals:   10/08/12 2245  BP: 155/94  Pulse: 80  Temp:   Resp: 15    Post vital signs: Reviewed  Level of consciousness: sedated  Complications: No apparent anesthesia complications

## 2012-10-08 NOTE — Anesthesia Preprocedure Evaluation (Addendum)
Anesthesia Evaluation  Patient identified by MRN, date of birth, ID band Patient awake    Reviewed: Allergy & Precautions, H&P , NPO status , Patient's Chart, lab work & pertinent test results  Airway Mallampati: II TM Distance: >3 FB Neck ROM: Full    Dental  (+) Teeth Intact, Dental Advisory Given and Caps,    Pulmonary neg pulmonary ROS, asthma ,  breath sounds clear to auscultation  Pulmonary exam normal       Cardiovascular hypertension, Pt. on medications Rhythm:Regular Rate:Normal     Neuro/Psych Depression Cervical disc disease negative neurological ROS  negative psych ROS   GI/Hepatic negative GI ROS, Neg liver ROS,   Endo/Other  negative endocrine ROS  Renal/GU negative Renal ROS  negative genitourinary   Musculoskeletal negative musculoskeletal ROS (+)   Abdominal   Peds  Hematology negative hematology ROS (+)   Anesthesia Other Findings   Reproductive/Obstetrics negative OB ROS                         Anesthesia Physical Anesthesia Plan  ASA: II and emergent  Anesthesia Plan: General   Post-op Pain Management:    Induction: Intravenous  Airway Management Planned: LMA  Additional Equipment:   Intra-op Plan:   Post-operative Plan: Extubation in OR  Informed Consent: I have reviewed the patients History and Physical, chart, labs and discussed the procedure including the risks, benefits and alternatives for the proposed anesthesia with the patient or authorized representative who has indicated his/her understanding and acceptance.   Dental advisory given  Plan Discussed with: CRNA  Anesthesia Plan Comments:         Anesthesia Quick Evaluation

## 2012-10-08 NOTE — Brief Op Note (Signed)
10/08/2012  10:09 PM  PATIENT:  Cicero Duck  51 y.o. male  PRE-OPERATIVE DIAGNOSIS:  left ureteral stone  POST-OPERATIVE DIAGNOSIS:  left ureteral stone  PROCEDURE:  Procedure(s) (LRB) with comments: CYSTOSCOPY/RETROGRADE/URETEROSCOPY (Left) - stent   SURGEON:  Surgeon(s) and Role:    * Sebastian Ache, MD - Primary  PHYSICIAN ASSISTANT:   ASSISTANTS: none   ANESTHESIA:   general  EBL:     BLOOD ADMINISTERED:none  DRAINS: none   LOCAL MEDICATIONS USED:  NONE  SPECIMEN:  No Specimen  DISPOSITION OF SPECIMEN:  N/A  COUNTS:  YES  TOURNIQUET:  * No tourniquets in log *  DICTATION: .Other Dictation: Dictation Number [id number]  PLAN OF CARE: Admit for overnight observation  PATIENT DISPOSITION:  PACU - hemodynamically stable.   Delay start of Pharmacological VTE agent (>24hrs) due to surgical blood loss or risk of bleeding: no

## 2012-10-08 NOTE — H&P (Signed)
Christopher Bolton is an 51 y.o. male.   Chief Complaint: Left Ureteral Stone, Acute Renal Failure, Flank Pain HPI:   1 - Left Ureteral Stone, Flank Pain, Acute Renal Failure - Pt with 3rd episode of nephrolithiasis with 4mm left mid ureteral stone SSD 13cm, 400HU. No additional stones by CT. Was orrignally seen in ER last week and sent out with medical therapy. He returns today with refractory nausea, flank pain, and now Cr up to 1.6 from 0.99 consist ant with acute renal failure. Has used NSAIDS in past several days. Last meal >8 hours ago.  Denies fever, dysuria, hematuria, or baseline voiding complaints. No prior metabolic eval for stone disease.   PMH sig for TNA, lap chole. No CV disease. No strong blood thinners.   Past Medical History  Diagnosis Date  . Allergy   . Depression   . Asthma   . Hypertension     Past Surgical History  Procedure Date  . Eye surgery   . Gallbladder surgery 2002  . Cholecystectomy     Family History  Problem Relation Age of Onset  . Diabetes Mother    Social History:  reports that he has never smoked. He has never used smokeless tobacco. He reports that he drinks alcohol. He reports that he does not use illicit drugs.  Allergies: No Known Allergies   (Not in a hospital admission)  Results for orders placed during the hospital encounter of 10/08/12 (from the past 48 hour(s))  URINALYSIS, ROUTINE W REFLEX MICROSCOPIC     Status: Abnormal   Collection Time   10/08/12 11:57 AM      Component Value Range Comment   Color, Urine YELLOW  YELLOW    APPearance CLEAR  CLEAR    Specific Gravity, Urine 1.024  1.005 - 1.030    pH 5.0  5.0 - 8.0    Glucose, UA NEGATIVE  NEGATIVE mg/dL    Hgb urine dipstick NEGATIVE  NEGATIVE    Bilirubin Urine NEGATIVE  NEGATIVE    Ketones, ur 15 (*) NEGATIVE mg/dL    Protein, ur NEGATIVE  NEGATIVE mg/dL    Urobilinogen, UA 0.2  0.0 - 1.0 mg/dL    Nitrite NEGATIVE  NEGATIVE    Leukocytes, UA NEGATIVE  NEGATIVE  MICROSCOPIC NOT DONE ON URINES WITH NEGATIVE PROTEIN, BLOOD, LEUKOCYTES, NITRITE, OR GLUCOSE <1000 mg/dL.  BASIC METABOLIC PANEL     Status: Abnormal   Collection Time   10/08/12  1:35 PM      Component Value Range Comment   Sodium 134 (*) 135 - 145 mEq/L    Potassium 3.9  3.5 - 5.1 mEq/L    Chloride 95 (*) 96 - 112 mEq/L    CO2 27  19 - 32 mEq/L    Glucose, Bld 110 (*) 70 - 99 mg/dL    BUN 21  6 - 23 mg/dL    Creatinine, Ser 4.78 (*) 0.50 - 1.35 mg/dL    Calcium 9.2  8.4 - 29.5 mg/dL    GFR calc non Af Amer 48 (*) >90 mL/min    GFR calc Af Amer 55 (*) >90 mL/min    Ct Abdomen Pelvis Wo Contrast  10/08/2012  *RADIOLOGY REPORT*  Clinical Data: Left flank pain.  Nausea.  CT ABDOMEN AND PELVIS WITHOUT CONTRAST  Technique:  Multidetector CT imaging of the abdomen and pelvis was performed following the standard protocol without intravenous contrast.  Comparison: Abdominal radiographs dated 10/08/2012  Findings: There is a 4 mm stone in  the mid left ureter at the level of the left transverse process of L4 causing moderate left hydronephrosis.  No renal calculi.  The liver, spleen, pancreas, and adrenal glands and right kidney are normal.  Gallbladder has been removed.  No dilated bile ducts. Terminal ileum and appendix are normal.  Colon is normal.  Tiny amount of free fluid in the pelvis.  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  Bilateral foraminal stenosis at that level.  IMPRESSION: 4 mm stone obstructing the mid left ureter.   Original Report Authenticated By: Francene Boyers, M.D.    Dg Abd 1 View  10/08/2012  *RADIOLOGY REPORT*  Clinical Data: Left flank pain. Known kidney stone.  Hematuria.  ABDOMEN - 1 VIEW  Comparison: Abdominal radiographs 10/04/2012 were reviewed  Findings: An approximately 4 mm calcific density is again seen in the left abdomen, near the level of this left spinous process of L4.  This is without significant change in appearance compared to  the abdominal radiograph of 10/04/2012.    This could be a left ureteral stone.  No definite stone is seen in the expected location of either renal shadow.  Bowel gas projects over the expected locations of the kidneys, particular on the right.  Cholecystectomy clips.  IMPRESSION: No significant change in approximately 4 mm calcification adjacent to the left transverse process of L4.  A left ureteral stone cannot be excluded.   Original Report Authenticated By: Britta Mccreedy, M.D.     Review of Systems  Constitutional: Negative.  Negative for fever and chills.  HENT: Negative.   Eyes: Negative.   Respiratory: Negative.   Cardiovascular: Negative.   Gastrointestinal: Positive for nausea. Negative for vomiting.  Genitourinary: Positive for flank pain. Negative for dysuria and hematuria.  Musculoskeletal: Negative.   Skin: Negative.   Neurological: Negative.   Endo/Heme/Allergies: Negative.   Psychiatric/Behavioral: Negative.     Blood pressure 144/97, pulse 92, temperature 98.8 F (37.1 C), temperature source Oral, resp. rate 16, SpO2 97.00%. Physical Exam  Constitutional: He is oriented to person, place, and time. He appears well-developed and well-nourished.       Here with family  HENT:  Head: Normocephalic and atraumatic.  Eyes: EOM are normal. Pupils are equal, round, and reactive to light.  Neck: Normal range of motion. Neck supple.  Cardiovascular: Normal rate and regular rhythm.   Respiratory: Effort normal and breath sounds normal.  GI: Soft. Bowel sounds are normal.  Genitourinary: Penis normal.       Moderate Left CVAT  Musculoskeletal: Normal range of motion.  Neurological: He is alert and oriented to person, place, and time.  Skin: Skin is warm and dry.  Psychiatric: He has a normal mood and affect. His behavior is normal. Judgment and thought content normal.     Assessment/Plan 1 - Left Ureteral Stone, Flank Pain, Acute Renal Failure - We discussed management options  of observation / continued MET (not recommended as GFR decline), SWL or stent then SWL (not recommended as recent NSAIDS) or ureteroscopic stone manipulation. He wants to proceed with the latter. Risks including bleeding, infection, need for stent, need for staged surgery, damage to kidney / ureter / bladder as well as rare risks such as DVT, PE, MI, CVA, mortality discussed.  Admit for hydration, pain control, serial labs and add-on ureteroscopic stone manipulation.   Mali Eppard 10/08/2012, 4:13 PM

## 2012-10-09 ENCOUNTER — Encounter (HOSPITAL_COMMUNITY): Payer: Self-pay | Admitting: Urology

## 2012-10-09 ENCOUNTER — Other Ambulatory Visit: Payer: Self-pay | Admitting: Urology

## 2012-10-09 LAB — BASIC METABOLIC PANEL
BUN: 17 mg/dL (ref 6–23)
GFR calc Af Amer: 75 mL/min — ABNORMAL LOW (ref >90)
GFR calc non Af Amer: 65 mL/min — ABNORMAL LOW (ref >90)
Potassium: 4.1 mEq/L (ref 3.5–5.1)
Sodium: 135 mEq/L (ref 135–145)

## 2012-10-09 NOTE — Discharge Summary (Signed)
Physician Discharge Summary  Patient ID: Christopher Bolton MRN: [id number] DOB/AGE: 01-28-1961 51 y.o.  Admit date: 10/08/2012 Discharge date: 10/09/2012  Admission Diagnoses: Left Ureteral Stone, Flank Pain  Discharge Diagnoses:  Left Ureteral Stone, Flank Pain   Discharged Condition: good  Hospital Course:   Pt admitted through ER 12/1 for left ureteral stone and refractory pain. Underwent attempt left ureteroscopic stone manipulation however anatomy not favorable and decided to place ureteral stent only to allow for ureteral dilation and stone manipulation in a staged fashion. Pt's Cr 1.6 at admit. By 12/2 his pain was controlled, he was ambulatory, tolerating diet, and his Cr was trending back toward normal at 1.27 and felt to be adequate for discharge.    Consults: None  Significant Diagnostic Studies: radiology: CT scan: Left ureteral stone 12/1  Treatments: surgery: Left ureteroscopy, stenting 12/1  Discharge Exam: Blood pressure 144/95, pulse 68, temperature 97.7 F (36.5 C), temperature source Oral, resp. rate 18, height 5\' 9"  (1.753 m), weight 99.338 kg (219 lb), SpO2 99.00%. General appearance: alert, cooperative and appears stated age Head: Normocephalic, without obvious abnormality, atraumatic Eyes: conjunctivae/corneas clear. PERRL, EOM's intact. Fundi benign. Ears: normal TM's and external ear canals both ears Nose: Nares normal. Septum midline. Mucosa normal. No drainage or sinus tenderness. Neck: no adenopathy, no carotid bruit, no JVD, supple, symmetrical, trachea midline and thyroid not enlarged, symmetric, no tenderness/mass/nodules Chest wall: no tenderness Breasts: normal appearance, no masses or tenderness Male genitalia: normal Extremities: extremities normal, atraumatic, no cyanosis or edema Pulses: 2+ and symmetric Skin: Skin color, texture, turgor normal. No rashes or lesions Lymph nodes: Cervical, supraclavicular, and axillary nodes normal. Neurologic:  Grossly normal  Disposition: 01-Home or Self Care     Medication List     As of 10/09/2012  7:34 AM    TAKE these medications         albuterol 108 (90 BASE) MCG/ACT inhaler   Commonly known as: PROVENTIL HFA;VENTOLIN HFA   Inhale 2 puffs into the lungs every 4 (four) hours as needed.      amphetamine-dextroamphetamine 10 MG 24 hr capsule   Commonly known as: ADDERALL XR   Take 10 mg by mouth every morning. Pt is on a  Staggered dosage.  Taking 5 tablets beginning on Monday, tapering down 1 tablet daily      azithromycin 250 MG tablet   Commonly known as: ZITHROMAX   Take 2 tabs PO x 1 dose, then 1 tab PO QD x 4 days      buPROPion 150 MG 24 hr tablet   Commonly known as: WELLBUTRIN XL   Take 450 mg by mouth daily.      fluticasone 50 MCG/ACT nasal spray   Commonly known as: FLONASE   Place 2 sprays into the nose daily.      levocetirizine 5 MG tablet   Commonly known as: XYZAL   Take 1 tablet (5 mg total) by mouth every evening.      losartan 50 MG tablet   Commonly known as: COZAAR   Take 1 tablet (50 mg total) by mouth daily.      montelukast 10 MG tablet   Commonly known as: SINGULAIR   Take 1 tablet (10 mg total) by mouth at bedtime.      naproxen sodium 220 MG tablet   Commonly known as: ANAPROX   Take 220 mg by mouth 2 (two) times daily with a meal.      ondansetron 4 MG disintegrating tablet  Commonly known as: ZOFRAN-ODT   Take 1 tablet (4 mg total) by mouth every 8 (eight) hours as needed for nausea.      oxybutynin 5 MG tablet   Commonly known as: DITROPAN   Take 1 tablet (5 mg total) by mouth every 8 (eight) hours as needed. For bladder spasms      oxyCODONE-acetaminophen 7.5-325 MG per tablet   Commonly known as: PERCOCET   Take 1 tablet by mouth every 4 (four) hours as needed for pain.      oxyCODONE-acetaminophen 5-325 MG per tablet   Commonly known as: PERCOCET/ROXICET   Take 1 tablet by mouth every 4 (four) hours as needed for pain.       predniSONE 20 MG tablet   Commonly known as: DELTASONE   Take 3 PO QAM x3days, 2 PO QAM x3days, 1 PO QAM x3days      sennosides-docusate sodium 8.6-50 MG tablet   Commonly known as: SENOKOT-S   Take 1 tablet by mouth 2 (two) times daily. While taking pain meds to prevent constipation      Tamsulosin HCl 0.4 MG Caps   Commonly known as: FLOMAX   Take 1 capsule (0.4 mg total) by mouth daily.      Tamsulosin HCl 0.4 MG Caps   Commonly known as: FLOMAX   Take 1 capsule (0.4 mg total) by mouth daily. To reduce stent discomfort and help pass kidney stone           Follow-up Information    Follow up with [facility], Alisi Lupien, MD. (We will contact you to set up next surgery in 1-2 weeks.)    Contact information:   [address] [place] [address] [phone]          Signed: Sebastian Ache 10/09/2012, 7:34 AM

## 2012-10-09 NOTE — Op Note (Signed)
NAMEROCZEN, WAYMIRE NO.:  [id number]  MEDICAL RECORD NO.:  [id number]  LOCATION:  1445                         FACILITY:  [facility]  PHYSICIAN:  Sebastian Ache, MD     DATE OF BIRTH:  1961/04/22  DATE OF PROCEDURE:10/08/2012 DATE OF DISCHARGE:                              OPERATIVE REPORT   DIAGNOSIS:  Left ureteral stone, refractory flank pain.  PROCEDURE: 1. Cystoscopy with left retrograde pyelogram interpretation. 2. Left diagnostic ureteroscopy. 3. Placement of left ureteral stent 6 x 26, no tether.  FINDINGS: 1. Mild left hydronephrosis, filling defect, and proximal ureter     stone. 2. Left ureteral caliber too narrow to warrant ureteroscopy to the     level of the stone.  SPECIMENS:  None.  COMPLICATIONS:  None.  ESTIMATED BLOOD LOSS:  Nil.  DRAINS:  None.  INDICATION:  Mr. Marzette is a 51 year old gentleman with history of prior nephrolithiasis x2.  He presented to the ER last week with left flank pain and KUB evidence of a ureteral stone.  He was given a trial of medical expulsive therapy, however he returned today with refractory flank pain as well as nausea and acute renal failure with creatinine of 1.6 up from baseline of 1.  Options were discussed including the medical expulsive therapy versus shockwave lithotripsy with or without stenting versus a trial of definitive ureteroscopy.  He wished to proceed with latter.  Informed consent was obtained and placed in medical record.  PROCEDURE IN DETAIL:  The patient being [facility], was verified. Procedure being cystoscopy bilateral ureteral stents exchange was confirmed.  Procedure was carried out.  Time-out was performed. Intravenous antibiotics administered.  General LMA anesthesia was introduced.  The patient was placed into a low lithotomy position. Sterile field was created by prepping and draping the patient's penis, perineum, and proximal thighs using iodine x3.  Next,  cystourethroscopy was performed using a 22-French rigid cystoscope with 30-degree lens. Inspection of the anterior urethra was unremarkable.  Section of urinary bladder revealed concentrated appearing urine.  Ureteral orifices were in their normal anatomic position.  The left ureteral orifice was then gently cannulated using a 6-French end-hole catheter and left retrograde pyelogram obtained.  Left retrograde pyelogram demonstrated a single left ureter single system left kidney.  There was mild hydronephrosis with ureteral nephrosis.  There is filling defect in the proximal ureter.  A 0.038 Glidewire was advanced at the level of upper pole and set aside as a safety wire.  A 8-French feeding tube was placed at the level urinary bladder as a pressure release catheter.  Next, a semi-rigid ureteroscopy was performed of the distal one-half of the ureter using a 6.4-French semi-rigid ureteroscope alongside a separate 0.038 sensor working wire in a train track type fashion.  This allowed examination again of the distal half of the ureter, no mucosal abnormalities or calcifications were encountered.  Sensor wire was left in place as a working wire and the semi-rigid ureteroscope was exchanged for a new 6-French flexible ureteroscope, which was placed to the level of the mid ureter over the working wire, a flexible ureteroscopy approximately 2-3 inches above this revealed no calcifications or mucosal abnormalities.  The ureter was very tight in this area and did not easily accommodate the ureteroscope and it could not be safely navigated to the level of the stone.  It was felt that interval stenting with staged approach would be safest.  As such, the ureteroscope was removed under continuous visualization, and no mucosal abnormalities were seen.  A new 6 x 26 double-J stent was placed with remaining safety wire using cystoscopic and fluoroscopic guidance, good proximal distal curl were noted.   Efflux of urine was seen around and through the distal end of the stent.  Bladder was emptied per cystoscope.  Procedure was then terminated.  The tolerated the procedure well.  The were no immediate periprocedural complications.  The patient was taken to the post anesthesia care unit in stable condition.          ______________________________ Sebastian Ache, MD     TM/MEDQ  D:  10/08/2012  T:  10/09/2012  Job:  [id number]

## 2012-10-09 NOTE — Progress Notes (Signed)
Patient discharged in stable condition to home with wife via car. Discharge instructions and prescriptions given. Erskin Burnet RN

## 2012-10-09 NOTE — ED Provider Notes (Signed)
History/physical exam/procedure(s) were performed by non-physician practitioner and as supervising physician I was immediately available for consultation/collaboration. I have reviewed all notes and am in agreement with care and plan.   Inza Mikrut S Raequon Catanzaro, MD 10/09/12 0828 

## 2012-10-24 ENCOUNTER — Encounter (HOSPITAL_BASED_OUTPATIENT_CLINIC_OR_DEPARTMENT_OTHER): Payer: Self-pay | Admitting: *Deleted

## 2012-10-24 NOTE — Progress Notes (Signed)
Pt instructed npo p mn 12/19 x adderall, welbutrin percocet w sip of water. Use albuterol and bring w/ him.  To [facility] 12/20 @ 0900.  Needs istat, ekg on arrival

## 2012-10-27 ENCOUNTER — Encounter (HOSPITAL_BASED_OUTPATIENT_CLINIC_OR_DEPARTMENT_OTHER): Payer: Self-pay | Admitting: Anesthesiology

## 2012-10-27 ENCOUNTER — Encounter (HOSPITAL_BASED_OUTPATIENT_CLINIC_OR_DEPARTMENT_OTHER): Payer: Self-pay | Admitting: *Deleted

## 2012-10-27 ENCOUNTER — Ambulatory Visit (HOSPITAL_BASED_OUTPATIENT_CLINIC_OR_DEPARTMENT_OTHER)
Admission: RE | Admit: 2012-10-27 | Discharge: 2012-10-27 | Disposition: A | Discharge status: Home / Self Care | Payer: 59 | Source: Ambulatory Visit | Attending: Urology | Admitting: Urology

## 2012-10-27 ENCOUNTER — Encounter (HOSPITAL_BASED_OUTPATIENT_CLINIC_OR_DEPARTMENT_OTHER)
Admission: RE | Disposition: A | Discharge status: Home / Self Care | Payer: Self-pay | Source: Ambulatory Visit | Attending: Urology

## 2012-10-27 ENCOUNTER — Ambulatory Visit (HOSPITAL_BASED_OUTPATIENT_CLINIC_OR_DEPARTMENT_OTHER): Payer: 59 | Admitting: Anesthesiology

## 2012-10-27 DIAGNOSIS — N201 Calculus of ureter: Secondary | ICD-10-CM | POA: Insufficient documentation

## 2012-10-27 DIAGNOSIS — J45909 Unspecified asthma, uncomplicated: Secondary | ICD-10-CM | POA: Insufficient documentation

## 2012-10-27 DIAGNOSIS — I10 Essential (primary) hypertension: Secondary | ICD-10-CM | POA: Insufficient documentation

## 2012-10-27 HISTORY — PX: HOLMIUM LASER APPLICATION: SHX5852

## 2012-10-27 HISTORY — PX: CYSTOSCOPY W/ URETERAL STENT REMOVAL: SHX1430

## 2012-10-27 HISTORY — PX: CYSTOSCOPY WITH RETROGRADE PYELOGRAM, URETEROSCOPY AND STENT PLACEMENT: SHX5789

## 2012-10-27 LAB — POCT I-STAT, CHEM 8
Glucose, Bld: 107 mg/dL — ABNORMAL HIGH (ref 70–99)
HCT: 43 % (ref 39.0–52.0)
Hemoglobin: 14.6 g/dL (ref 13.0–17.0)
Potassium: 4 mEq/L (ref 3.5–5.1)

## 2012-10-27 SURGERY — CYSTOURETEROSCOPY, WITH RETROGRADE PYELOGRAM AND STENT INSERTION
Anesthesia: General | Site: Ureter | Laterality: Left | Wound class: Clean Contaminated

## 2012-10-27 MED ORDER — PROMETHAZINE HCL 25 MG/ML IJ SOLN: 6.2500 mg | INTRAMUSCULAR | Status: DC | PRN

## 2012-10-27 MED ORDER — IOHEXOL 300 MG/ML  SOLN
INTRAMUSCULAR | Status: DC | PRN
  Administered 2012-10-27: 10 mL

## 2012-10-27 MED ORDER — OXYCODONE-ACETAMINOPHEN 5-325 MG PO TABS
1.0000 | ORAL_TABLET | ORAL | Status: DC | PRN
Start: 2012-10-27 — End: 2013-08-01

## 2012-10-27 MED ORDER — DEXAMETHASONE SODIUM PHOSPHATE 4 MG/ML IJ SOLN
INTRAMUSCULAR | Status: DC | PRN
  Administered 2012-10-27: 10 mg via INTRAVENOUS

## 2012-10-27 MED ORDER — KETOROLAC TROMETHAMINE 30 MG/ML IJ SOLN
INTRAMUSCULAR | Status: DC | PRN
  Administered 2012-10-27: 30 mg via INTRAVENOUS

## 2012-10-27 MED ORDER — LACTATED RINGERS IV SOLN
INTRAVENOUS | Status: DC
  Administered 2012-10-27: 100 mL/h via INTRAVENOUS
  Filled 2012-10-27: qty 1000

## 2012-10-27 MED ORDER — FENTANYL CITRATE 0.05 MG/ML IJ SOLN: 25.0000 ug | INTRAMUSCULAR | Status: DC | PRN

## 2012-10-27 MED ORDER — ACETAMINOPHEN 10 MG/ML IV SOLN
INTRAVENOUS | Status: DC | PRN
  Administered 2012-10-27: 1000 mg via INTRAVENOUS

## 2012-10-27 MED ORDER — SODIUM CHLORIDE 0.9 % IR SOLN
Status: DC | PRN
  Administered 2012-10-27: 6000 mL

## 2012-10-27 MED ORDER — PROPOFOL 10 MG/ML IV BOLUS
INTRAVENOUS | Status: DC | PRN
  Administered 2012-10-27: 100 mg via INTRAVENOUS
  Administered 2012-10-27: 200 mg via INTRAVENOUS

## 2012-10-27 MED ORDER — FENTANYL CITRATE 0.05 MG/ML IJ SOLN
INTRAMUSCULAR | Status: DC | PRN
  Administered 2012-10-27 (×4): 50 ug via INTRAVENOUS

## 2012-10-27 MED ORDER — LIDOCAINE HCL (CARDIAC) 20 MG/ML IV SOLN
INTRAVENOUS | Status: DC | PRN
  Administered 2012-10-27: 80 mg via INTRAVENOUS

## 2012-10-27 MED ORDER — ONDANSETRON HCL 4 MG/2ML IJ SOLN
INTRAMUSCULAR | Status: DC | PRN
  Administered 2012-10-27: 4 mg via INTRAVENOUS

## 2012-10-27 MED ORDER — GENTAMICIN IN SALINE 1.6-0.9 MG/ML-% IV SOLN
80.0000 mg | INTRAVENOUS | Status: DC
  Filled 2012-10-27: qty 50

## 2012-10-27 MED ORDER — MIDAZOLAM HCL 5 MG/5ML IJ SOLN
INTRAMUSCULAR | Status: DC | PRN
  Administered 2012-10-27: 2 mg via INTRAVENOUS

## 2012-10-27 MED ORDER — GENTAMICIN SULFATE 40 MG/ML IJ SOLN
5.0000 mg/kg | Freq: Once | INTRAVENOUS | Status: AC
  Administered 2012-10-27: 490 mg via INTRAVENOUS
  Filled 2012-10-27 (×2): qty 12.25

## 2012-10-27 SURGICAL SUPPLY — 44 items
ADAPTER CATH URET PLST 4-6FR (CATHETERS) ×2 IMPLANT
BAG DRAIN URO-CYSTO SKYTR STRL (DRAIN) ×2 IMPLANT
BAG URO CATCHER STRL LF (DRAPE) ×2 IMPLANT
BASKET LASER NITINOL 1.9FR (BASKET) ×4 IMPLANT
BASKET STNLS GEMINI 4WIRE 3FR (BASKET) IMPLANT
BASKET ZERO TIP NITINOL 2.4FR (BASKET) IMPLANT
BRUSH URET BIOPSY 3F (UROLOGICAL SUPPLIES) IMPLANT
CANISTER SUCT LVC 12 LTR MEDI- (MISCELLANEOUS) ×2 IMPLANT
CATH FOLEY 2WAY  3CC  8FR (CATHETERS)
CATH FOLEY 2WAY 3CC 8FR (CATHETERS) IMPLANT
CATH INTERMIT  6FR 70CM (CATHETERS) IMPLANT
CATH URET 5FR 28IN CONE TIP (BALLOONS)
CATH URET 5FR 28IN OPEN ENDED (CATHETERS) ×2 IMPLANT
CATH URET 5FR 70CM CONE TIP (BALLOONS) IMPLANT
CLOTH BEACON ORANGE TIMEOUT ST (SAFETY) ×2 IMPLANT
DRAPE CAMERA CLOSED 9X96 (DRAPES) ×2 IMPLANT
ELECT REM PT RETURN 9FT ADLT (ELECTROSURGICAL)
ELECTRODE REM PT RTRN 9FT ADLT (ELECTROSURGICAL) IMPLANT
GLOVE BIO SURGEON STRL SZ7 (GLOVE) ×2 IMPLANT
GLOVE BIO SURGEON STRL SZ7.5 (GLOVE) ×2 IMPLANT
GLOVE BIOGEL PI IND STRL 6.5 (GLOVE) ×1 IMPLANT
GLOVE BIOGEL PI IND STRL 7.0 (GLOVE) ×1 IMPLANT
GLOVE BIOGEL PI INDICATOR 6.5 (GLOVE) ×1
GLOVE BIOGEL PI INDICATOR 7.0 (GLOVE) ×1
GOWN PREVENTION PLUS LG XLONG (DISPOSABLE) IMPLANT
GOWN PREVENTION PLUS XLARGE (GOWN DISPOSABLE) IMPLANT
GOWN STRL NON-REIN LRG LVL3 (GOWN DISPOSABLE) ×4 IMPLANT
GOWN STRL REIN XL XLG (GOWN DISPOSABLE) IMPLANT
GUIDEWIRE 0.038 PTFE COATED (WIRE) IMPLANT
GUIDEWIRE ANG ZIPWIRE 038X150 (WIRE) ×2 IMPLANT
GUIDEWIRE STR DUAL SENSOR (WIRE) ×2 IMPLANT
IV NS IRRIG 3000ML ARTHROMATIC (IV SOLUTION) ×4 IMPLANT
KIT BALLIN UROMAX 15FX10 (LABEL) IMPLANT
KIT BALLN UROMAX 15FX4 (MISCELLANEOUS) IMPLANT
KIT BALLN UROMAX 26 75X4 (MISCELLANEOUS)
LASER FIBER DISP (UROLOGICAL SUPPLIES) ×2 IMPLANT
PACK CYSTOSCOPY (CUSTOM PROCEDURE TRAY) ×2 IMPLANT
SET HIGH PRES BAL DIL (LABEL)
SHEATH URET ACCESS 12FR/35CM (UROLOGICAL SUPPLIES) ×2 IMPLANT
SHEATH URET ACCESS 12FR/55CM (UROLOGICAL SUPPLIES) IMPLANT
STENT URET 6FRX26 CONTOUR (STENTS) ×2 IMPLANT
SYRINGE 10CC LL (SYRINGE) ×2 IMPLANT
SYRINGE IRR TOOMEY STRL 70CC (SYRINGE) IMPLANT
TUBE FEEDING 8FR 16IN STR KANG (MISCELLANEOUS) IMPLANT

## 2012-10-27 NOTE — Anesthesia Preprocedure Evaluation (Signed)
Anesthesia Evaluation  Patient identified by MRN, date of birth, ID band Patient awake    Reviewed: Allergy & Precautions, H&P , NPO status , Patient's Chart, lab work & pertinent test results, reviewed documented beta blocker date and time   Airway Mallampati: II TM Distance: >3 FB Neck ROM: full    Dental No notable dental hx.    Pulmonary asthma ,  breath sounds clear to auscultation  Pulmonary exam normal       Cardiovascular Exercise Tolerance: Good hypertension, On Medications Rhythm:regular Rate:Normal     Neuro/Psych PSYCHIATRIC DISORDERS negative neurological ROS  negative psych ROS   GI/Hepatic negative GI ROS, Neg liver ROS,   Endo/Other  negative endocrine ROS  Renal/GU negative Renal ROS  negative genitourinary   Musculoskeletal   Abdominal   Peds  Hematology negative hematology ROS (+)   Anesthesia Other Findings   Reproductive/Obstetrics negative OB ROS                           Anesthesia Physical Anesthesia Plan  ASA: II  Anesthesia Plan: General   Post-op Pain Management:    Induction:   Airway Management Planned: LMA  Additional Equipment:   Intra-op Plan:   Post-operative Plan:   Informed Consent: I have reviewed the patients History and Physical, chart, labs and discussed the procedure including the risks, benefits and alternatives for the proposed anesthesia with the patient or authorized representative who has indicated his/her understanding and acceptance.   Dental Advisory Given  Plan Discussed with: CRNA  Anesthesia Plan Comments: (LMA #4 on 10/08/12)        Anesthesia Quick Evaluation

## 2012-10-27 NOTE — Brief Op Note (Signed)
10/27/2012  11:05 AM  PATIENT:  Christopher Bolton  51 y.o. male  PRE-OPERATIVE DIAGNOSIS:  left ureteral stone  POST-OPERATIVE DIAGNOSIS:  left ureteral stone  PROCEDURE:  Procedure(s) (LRB) with comments: CYSTOSCOPY WITH RETROGRADE PYELOGRAM, URETEROSCOPY AND STENT PLACEMENT (Left) HOLMIUM LASER APPLICATION (Left) CYSTOSCOPY WITH STENT REMOVAL (Left)  SURGEON:  Surgeon(s) and Role:    * Sebastian Ache, MD - Primary  PHYSICIAN ASSISTANT:   ASSISTANTS: none   ANESTHESIA:   general  EBL:  Total I/O In: 100 [I.V.:100] Out: -   BLOOD ADMINISTERED:none  DRAINS: none   LOCAL MEDICATIONS USED:  NONE  SPECIMEN:  Source of Specimen:  left renal pelvis  DISPOSITION OF SPECIMEN:  [facility] for compositional analysis  COUNTS:  YES  TOURNIQUET:  * No tourniquets in log *  DICTATION: .Other Dictation: Dictation Number [id number]  PLAN OF CARE: Discharge to home after PACU  PATIENT DISPOSITION:  PACU - hemodynamically stable.   Delay start of Pharmacological VTE agent (>24hrs) due to surgical blood loss or risk of bleeding: no

## 2012-10-27 NOTE — Anesthesia Postprocedure Evaluation (Signed)
  Anesthesia Post-op Note  Patient: Christopher Bolton  Procedure(s) Performed: Procedure(s) (LRB): CYSTOSCOPY WITH RETROGRADE PYELOGRAM, URETEROSCOPY AND STENT PLACEMENT (Left) HOLMIUM LASER APPLICATION (Left) CYSTOSCOPY WITH STENT REMOVAL (Left)  Patient Location: PACU  Anesthesia Type: General  Level of Consciousness: awake and alert   Airway and Oxygen Therapy: Patient Spontanous Breathing  Post-op Pain: mild  Post-op Assessment: Post-op Vital signs reviewed, Patient's Cardiovascular Status Stable, Respiratory Function Stable, Patent Airway and No signs of Nausea or vomiting  Last Vitals:  Filed Vitals:   10/27/12 1145  BP: 122/73  Pulse: 73  Temp: 36.6 C  Resp: 14    Post-op Vital Signs: stable   Complications: No apparent anesthesia complications

## 2012-10-27 NOTE — H&P (Signed)
Christopher Bolton is an 51 y.o. male.   Chief Complaint: Pre-op Left Ureteroscopic Stone Manipulation HPI:   1 - Left Ureteral Stone - Pt with 3rd episode of nephrolithiasis with 4mm left mid ureteral stone SSD 13cm, 400HU. No additional stones by CT. He had left ureteral stent placed 10/08/2012 for refractory pain and acute renal failure, definitive stone surgery not possible at that time due to very narrow ureter. He now presents for attempt and ureteroscopy now that he has had stent in place for 2 weeks to allow for passive dilation.  Denies fever, dysuria, hematuria, or baseline voiding complaints. No prior metabolic eval for stone disease.   PMH sig for TNA, lap chole. No CV disease. No strong blood thinners.    Past Medical History  Diagnosis Date  . Allergy   . Depression   . Asthma   . Hypertension   . Ureteral stone   . Dysuria-frequency syndrome   . History of nausea     related to stone    Past Surgical History  Procedure Date  . Eye surgery   . Gallbladder surgery 2002  . Cholecystectomy   . Cystoscopy/retrograde/ureteroscopy 10/08/2012    Procedure: CYSTOSCOPY/RETROGRADE/URETEROSCOPY;  Surgeon: Sebastian Ache, MD;  Location: [facility];  Service: Urology;  Laterality: Left;  stent   . Hand surgery     Family History  Problem Relation Age of Onset  . Diabetes Mother    Social History:  reports that he has never smoked. He has never used smokeless tobacco. He reports that he drinks alcohol. He reports that he does not use illicit drugs.  Allergies: No Known Allergies  No prescriptions prior to admission    No results found for this or any previous visit (from the past 48 hour(s)). No results found.  Review of Systems  Constitutional: Negative.  Negative for fever and chills.  HENT: Negative.   Eyes: Negative.   Respiratory: Negative.   Cardiovascular: Negative.   Gastrointestinal: Negative.   Genitourinary: Positive for flank pain.       Consistent with stent  colic  Musculoskeletal: Negative.   Skin: Negative.   Neurological: Negative.   Endo/Heme/Allergies: Negative.   Psychiatric/Behavioral: Negative.     Height 5\' 9"  (1.753 m), weight 99.338 kg (219 lb). Physical Exam  Constitutional: He appears well-developed and well-nourished.  HENT:  Head: Normocephalic and atraumatic.  Eyes: EOM are normal. Pupils are equal, round, and reactive to light.  Neck: Normal range of motion. Neck supple.  Cardiovascular: Normal rate.   Respiratory: Effort normal.  GI: Soft. Bowel sounds are normal.  Genitourinary: Penis normal.  Musculoskeletal: Normal range of motion.  Neurological: He is alert.  Skin: Skin is warm.  Psychiatric: He has a normal mood and affect. Judgment and thought content normal.     Assessment/Plan  1 - Left Ureteral Stone - We re-discussed management strategies of ureteral stones including medical expulsive therapy (MET) (preferred for stones <64mm diameter), ureteroscopic stone manipulation (URS), and shockwave lithotripsy (SWL) in detail including relative risks / benefits / and efficacy. We discussed that all patients are candidates for MET as long as can keep comfortable and hydrated. After consideration of options, the patient has decided to proceed with URS today   We re-discussed ureteroscopic stone manipulation with basketing and laser-lithotripsy in detail.  We discussed risks including bleeding, infection, damage to kidney / ureter  bladder, rarely loss of kidney. We discussed anesthetic risks and rare but serious surgical complications including DVT, PE, MI, and  mortality. We specifically addressed that in 5-10% of cases a staged approach is required with stenting followed by re-attempt ureteroscopy if anatomy unfavorable. The patient voiced understanding and wises to proceed.   Christopher Bolton 10/27/2012, 6:16 AM

## 2012-10-27 NOTE — Anesthesia Procedure Notes (Signed)
Procedure Name: LMA Insertion Date/Time: 10/27/2012 10:15 AM Performed by: Maris Berger T Pre-anesthesia Checklist: Patient identified, Emergency Drugs available, Suction available and Patient being monitored Patient Re-evaluated:Patient Re-evaluated prior to inductionOxygen Delivery Method: Circle System Utilized Preoxygenation: Pre-oxygenation with 100% oxygen Intubation Type: IV induction Ventilation: Mask ventilation without difficulty LMA: LMA inserted LMA Size: 5.0 Number of attempts: 1 Placement Confirmation: positive ETCO2 Dental Injury: Teeth and Oropharynx as per pre-operative assessment  Comments: Gauze roll between teeth

## 2012-10-27 NOTE — Transfer of Care (Signed)
Immediate Anesthesia Transfer of Care Note  Patient: Jayd Cadieux  Procedure(s) Performed: Procedure(s) (LRB): CYSTOSCOPY WITH RETROGRADE PYELOGRAM, URETEROSCOPY AND STENT PLACEMENT (Left) HOLMIUM LASER APPLICATION (Left) CYSTOSCOPY WITH STENT REMOVAL (Left)  Patient Location: PACU  Anesthesia Type: General  Level of Consciousness: awake, sedated, patient cooperative and responds to stimulation  Airway & Oxygen Therapy: Patient Spontanous Breathing and Patient connected to face mask oxygen  Post-op Assessment: Report given to PACU RN, Post -op Vital signs reviewed and stable and Patient moving all extremities  Post vital signs: Reviewed and stable  Complications: No apparent anesthesia complications

## 2012-10-30 ENCOUNTER — Encounter (HOSPITAL_BASED_OUTPATIENT_CLINIC_OR_DEPARTMENT_OTHER): Payer: Self-pay | Admitting: Urology

## 2012-10-30 NOTE — Op Note (Signed)
[name], [name]  MEDICAL RECORD NO.:  [id number]  LOCATION:                               FACILITY:  [facility]  PHYSICIAN:  Sebastian Ache, MD     DATE OF BIRTH:  08/13/1961  DATE OF PROCEDURE:  10/27/2012 DATE OF DISCHARGE:  10/27/2012                              OPERATIVE REPORT   DIAGNOSIS:  Left ureteral stone.  PROCEDURES: 1. Left ureteroscopic stone manipulation with laser lithotripsy. 2. Left retrograde pyelogram interpretation. 3. Insertion of left ureteral stent, 6 x 26, with tether.  COMPLICATIONS:  None.  SPECIMEN:  Left renal stone to [facility] for compositional analysis.  ESTIMATED BLOOD LOSS:  Nil.  INDICATION:  Christopher Bolton is a pleasant 51 year old gentleman who had first episode of nephrolithiasis with a colic attack in early December. He underwent stenting at that time due to refractory pain, nausea, vomiting, and his proximal ureter was somewhat tortuous and did not allow for definitive stone treatment at that time.  He now presents for another attempted ureteroscopy, having had a stent in the interim to allow for passive dilation.  Informed consent was obtained and placed in the medical record.  PROCEDURE IN DETAIL:  The patient being [facility], was verified. Procedure being left ureteroscopic stone manipulation was confirmed. Procedure was carried out.  Time-out was performed.  Intravenous antibiotics were administered.  General LMA anesthesia was introduced. The patient was placed into a low-lithotomy position.  Sterile field was created by prepping and draping the patient's penis, perineum, and proximal thighs using iodine x3.  Next, cystourethroscopy was performed using a 22-French rigid cystoscope with 30-degree offset lens. Inspection of the anterior and posterior urethra was unremarkable. Inspection of the urinary bladder revealed a distal left ureteral stent in situ with expected bullous edema  of the left ureteral orifice.  No papillary lesions, calcifications were noted.  The distal end of the stent was grasped, brought to the level of the urethral meatus, through which, a 0.038 Glidewire was advanced at the level of the upper pole, this was exchanged for end-hole catheter and left retrograde pyelogram was seen.  Left retrograde pyelogram demonstrated a single left ureter with single system left kidney.  There was no filling defects or narrowing.  It was suspected that the stone had been pushed up into the renal pelvis.  The 0.038 Glidewire was once again advanced to set aside as a safety wire. The cystoscope was exchanged for the 6.4-French semi-rigid ureteroscope, and semi-rigid ureteroscopy was performed with additional 2/3rd of the ureter alongside a second separate Sensor wire with an 8-French feeding tube in the urinary bladder for pressure release.  The distal ureter was unremarkable.  The semi-rigid ureteroscope was then exchanged for a 12/14 38-cm ureteral access sheath under continuous fluoroscopy, placed at the level of the midureter.  Next, a flexible digital ureteroscope was used to perform flexible endoscopy of the proximal ureter and entire left renal collecting system.  This revealed the stone in question indeed within the left midpole calyx, it appeared to be too large for simple basketing, as such, holmium laser energy was applied to the stone using settings of 0.6 joules and  6 Hz.  It was fragmented into three smaller pieces.  These were then grasped with an escape-type basket and brought out in their entirety.  Repeat endoscopic examination of the entire collecting system revealed one residual calcification in the lower pole, this was also grasped and removed.  The sheath was then removed under continuous fluoroscopy and no mucosal abnormalities of the ureter were seen.  Finally, a new 6 x 26 double-J stent was placed over the remaining safety wire using  cystoscopic and fluoroscopic guidance. Good proximal and distal curl were noted.  Bladder was emptied per cystoscope and the tether was fashioned into the dorsum of the penis. Procedure was then terminated.          ______________________________ Sebastian Ache, MD     TM/MEDQ  D:  10/27/2012  T:  10/27/2012  Job:  [id number]

## 2012-11-08 HISTORY — PX: CERVICAL FUSION: SHX112

## 2013-02-13 ENCOUNTER — Telehealth: Payer: Self-pay

## 2013-02-13 DIAGNOSIS — I10 Essential (primary) hypertension: Secondary | ICD-10-CM

## 2013-02-13 MED ORDER — LOSARTAN POTASSIUM 50 MG PO TABS: 50.0000 mg | ORAL_TABLET | Freq: Every day | ORAL | Status: DC

## 2013-02-13 NOTE — Telephone Encounter (Signed)
Patient advised this has been sent in.

## 2013-02-13 NOTE — Telephone Encounter (Signed)
Pt has scheduled an appt with Dr. Cleta Alberts for 4/22. He will be about 2 weeks short on his BP meds (losartin?), can we refill until then?  [facility] [address]  Pt [phone]

## 2013-02-27 ENCOUNTER — Ambulatory Visit (INDEPENDENT_AMBULATORY_CARE_PROVIDER_SITE_OTHER): Payer: 59 | Admitting: Family Medicine

## 2013-02-27 ENCOUNTER — Encounter: Payer: Self-pay | Admitting: Family Medicine

## 2013-02-27 VITALS — BP 136/98 | HR 83 | Temp 98.5°F | Resp 16 | Ht 68.0 in | Wt 220.0 lb

## 2013-02-27 DIAGNOSIS — J309 Allergic rhinitis, unspecified: Secondary | ICD-10-CM

## 2013-02-27 DIAGNOSIS — I10 Essential (primary) hypertension: Secondary | ICD-10-CM

## 2013-02-27 DIAGNOSIS — M5030 Other cervical disc degeneration, unspecified cervical region: Secondary | ICD-10-CM

## 2013-02-27 MED ORDER — METHYLPREDNISOLONE ACETATE 80 MG/ML IJ SUSP
120.0000 mg | Freq: Once | INTRAMUSCULAR | Status: AC
  Administered 2013-02-27: 120 mg via INTRAMUSCULAR

## 2013-02-27 MED ORDER — LOSARTAN POTASSIUM 50 MG PO TABS: 50.0000 mg | ORAL_TABLET | Freq: Every day | ORAL | Status: DC

## 2013-02-27 MED ORDER — LEVOCETIRIZINE DIHYDROCHLORIDE 5 MG PO TABS: 5.0000 mg | ORAL_TABLET | Freq: Every evening | ORAL | Status: DC

## 2013-02-27 MED ORDER — MONTELUKAST SODIUM 10 MG PO TABS: 10.0000 mg | ORAL_TABLET | Freq: Every day | ORAL | Status: DC

## 2013-02-27 NOTE — Progress Notes (Signed)
S: This 52 y.o. Cauc male is here for follow-up and medication refills. Below are the issues that he wants to address today:  HTN- He did not take his medication yesterday. He denies fatigue or diaphoresis, CP or tightness, palpitations, edema, HA, dizziness or weakness or syncope. He has a mild unprod cough that he attributes to allergies,which are quite significant.  Allergic rhinitis- Pt gives a hx of allergy testing years ago which showed reaction to all sorts of environmental allergens. He does not use the Fluticasone NS because it seems to  Worsen symptoms. He has tried a NETI pot but got a severe sinus infection in the past. He uses avoidance as well as "desensitization". He also has allergies to certain fruits (cantaloupe, watermelon, kiwi, mango and peaches).  DDD- C-spine- pt has constant pain which is positional at times, radiating to R shoulder. He opts not to see specialist at this time. He agrees that he "will know when it is time" to seek specialty evaluation. Chiropractic treatment from November 2012 to April 2013 was somewhat helpful.  Kidney stone- sees Dr. Sharl Ma (endocrinologist) who is treating hyperparathyroidism and Vitamin D deficiency. Pt will also be seeing Dr. Berneice Heinrich about kidney stone (pt has had 3 stones since age 44). Mother and mat. grandfather also have stones.   PMHx, Soc Hx and Fam Hx reviewed.   O:  Filed Vitals:   02/27/13 1329 02/27/13 1424  BP: 128/104 136/98  Pulse: 83   Temp: 98.5 F (36.9 C)   TempSrc: Oral   Resp: 16   Height: 5\' 8"  (1.727 m)   Weight: 220 lb (99.791 kg)   SpO2: 97%    GEN: In NAD; WN,WD. HEENT: [place]/AT; EOMI w/ clear conj/sclerae. EACs normal. TMs erythematous, slightly  Retracted and mildly scarred. Post ph erythematous w/ cobblestoning. NT sinuses. NECK: No LAN or TMG. COR: RRR; no m/g/r. LUNGS: CTA; no wheezes or rales. SKIN: W&D; faint erythematous rash on facial area (cheeks). NEURO: A&O x 3; CNs intact.  Nonfocal.  A/P: Hypertension - stable; advise pt to get home BP monitor and check BP daily. Continue current medication.  Plan: losartan (COZAAR) 50 MG tablet  Allergic rhinitis - continue Montelukast and Xyzal daily especially during "allergy season".   Plan: methylPREDNISolone acetate (DEPO-MEDROL) injection 120 mg  DDD (degenerative disc disease), cervical- pt will continue symptomatic treatment for now.

## 2013-02-27 NOTE — Patient Instructions (Signed)
Hypertension Information As your heart beats, it forces blood through your arteries. This force is your blood pressure. If the pressure is too high, it is called hypertension (HTN) or high blood pressure. HTN is dangerous because you may have it and not know it. High blood pressure may mean that your heart has to work harder to pump blood. Your arteries may be narrow or stiff. The extra work puts you at risk for heart disease, stroke, and other problems.  Blood pressure consists of two numbers, a higher number over a lower, 110/72, for example. It is stated as "110 over 72." The ideal is below 120 for the top number (systolic) and under 80 for the bottom (diastolic).  You should pay close attention to your blood pressure if you have certain conditions such as:  Heart failure.   Prior heart attack.   Diabetes   Chronic kidney disease.   Prior stroke.   Multiple risk factors for heart disease.  To see if you have HTN, your blood pressure should be measured while you are seated with your arm held at the level of the heart. It should be measured at least twice. A one-time elevated blood pressure reading (especially in the Emergency Department) does not mean that you need treatment. There may be conditions in which the blood pressure is different between your right and left arms. It is important to see your caregiver soon for a recheck. Most people have essential hypertension which means that there is not a specific cause. This type of high blood pressure may be lowered by changing lifestyle factors such as:  Stress.   Smoking.   Lack of exercise.   Excessive weight.   Drug/tobacco/alcohol use.   Eating less salt.  Most people do not have symptoms from high blood pressure until it has caused damage to the body. Effective treatment can often prevent, delay or reduce that damage. TREATMENT  Treatment for high blood pressure, when a cause has been identified, is directed at the cause. There  are a large number of medications to treat HTN. These fall into several categories, and your caregiver will help you select the medicines that are best for you. Medications may have side effects. You should review side effects with your caregiver. If your blood pressure stays high after you have made lifestyle changes or started on medicines,   Your medication(s) may need to be changed.   Other problems may need to be addressed.   Be certain you understand your prescriptions, and know how and when to take your medicine.   Be sure to follow up with your caregiver within the time frame advised (usually within two weeks) to have your blood pressure rechecked and to review your medications.   If you are taking more than one medicine to lower your blood pressure, make sure you know how and at what times they should be taken. Taking two medicines at the same time can result in blood pressure that is too low.  Document Released: 12/28/2005 Document Revised: 07/07/2011 Document Reviewed: 01/04/2008 [facility] Patient Information 2012 [place], [place].     Allergic Rhinitis Allergic rhinitis is when the mucous membranes in the nose respond to allergens. Allergens are particles in the air that cause your body to have an allergic reaction. This causes you to release allergic antibodies. Through a chain of events, these eventually cause you to release histamine into the blood stream (hence the use of antihistamines). Although meant to be protective to the body, it  is this release that causes your discomfort, such as frequent sneezing, congestion and an itchy runny nose.  CAUSES  The pollen allergens may come from grasses, trees, and weeds. This is seasonal allergic rhinitis, or "hay fever." Other allergens cause year-round allergic rhinitis (perennial allergic rhinitis) such as house dust mite allergen, pet dander and mold spores.  SYMPTOMS   Nasal stuffiness (congestion).  Runny, itchy nose with sneezing  and tearing of the eyes.  There is often an itching of the mouth, eyes and ears. It cannot be cured, but it can be controlled with medications. DIAGNOSIS  If you are unable to determine the offending allergen, skin or blood testing may find it. TREATMENT   Avoid the allergen.  Medications and allergy shots (immunotherapy) can help.  Hay fever may often be treated with antihistamines in pill or nasal spray forms. Antihistamines block the effects of histamine. There are over-the-counter medicines that may help with nasal congestion and swelling around the eyes. Check with your caregiver before taking or giving this medicine. If the treatment above does not work, there are many new medications your caregiver can prescribe. Stronger medications may be used if initial measures are ineffective. Desensitizing injections can be used if medications and avoidance fails. Desensitization is when a patient is given ongoing shots until the body becomes less sensitive to the allergen. Make sure you follow up with your caregiver if problems continue. SEEK MEDICAL CARE IF:   You develop fever (more than 100.5 F (38.1 C).  You develop a cough that does not stop easily (persistent).  You have shortness of breath.  You start wheezing.  Symptoms interfere with normal daily activities. Document Released: 07/20/2001 Document Revised: 01/17/2012 Document Reviewed: 01/29/2009 [facility] Patient Information 2013 [place], [place].

## 2013-04-09 ENCOUNTER — Other Ambulatory Visit: Payer: Self-pay | Admitting: Emergency Medicine

## 2013-06-19 ENCOUNTER — Encounter: Payer: 59 | Admitting: Emergency Medicine

## 2013-07-03 ENCOUNTER — Encounter: Payer: 59 | Admitting: Emergency Medicine

## 2013-07-23 ENCOUNTER — Encounter: Payer: Self-pay | Admitting: Emergency Medicine

## 2013-07-23 ENCOUNTER — Ambulatory Visit (INDEPENDENT_AMBULATORY_CARE_PROVIDER_SITE_OTHER): Payer: 59 | Admitting: Emergency Medicine

## 2013-07-23 VITALS — BP 132/94 | HR 76 | Temp 98.4°F | Resp 16 | Ht 68.0 in | Wt 220.0 lb

## 2013-07-23 DIAGNOSIS — Z0000 Encounter for general adult medical examination without abnormal findings: Secondary | ICD-10-CM

## 2013-07-23 DIAGNOSIS — R251 Tremor, unspecified: Secondary | ICD-10-CM

## 2013-07-23 DIAGNOSIS — I10 Essential (primary) hypertension: Secondary | ICD-10-CM

## 2013-07-23 DIAGNOSIS — Z1211 Encounter for screening for malignant neoplasm of colon: Secondary | ICD-10-CM

## 2013-07-23 DIAGNOSIS — Z9109 Other allergy status, other than to drugs and biological substances: Secondary | ICD-10-CM

## 2013-07-23 DIAGNOSIS — M542 Cervicalgia: Secondary | ICD-10-CM

## 2013-07-23 LAB — POCT URINALYSIS DIPSTICK
Blood, UA: NEGATIVE
Glucose, UA: NEGATIVE
Nitrite, UA: NEGATIVE
Urobilinogen, UA: 0.2
pH, UA: 6

## 2013-07-23 LAB — COMPREHENSIVE METABOLIC PANEL
ALT: 21 U/L (ref 0–53)
AST: 17 U/L (ref 0–37)
Albumin: 4 g/dL (ref 3.5–5.2)
Alkaline Phosphatase: 83 U/L (ref 39–117)
BUN: 12 mg/dL (ref 6–23)
CO2: 25 mEq/L (ref 19–32)
Calcium: 8.9 mg/dL (ref 8.4–10.5)
Chloride: 103 mEq/L (ref 96–112)
Creat: 1 mg/dL (ref 0.50–1.35)
Glucose, Bld: 95 mg/dL (ref 70–99)
Potassium: 4.3 mEq/L (ref 3.5–5.3)
Sodium: 137 mEq/L (ref 135–145)
Total Bilirubin: 0.8 mg/dL (ref 0.3–1.2)
Total Protein: 6.1 g/dL (ref 6.0–8.3)

## 2013-07-23 LAB — IFOBT (OCCULT BLOOD): IFOBT: NEGATIVE

## 2013-07-23 LAB — CBC
MCH: 29.7 pg (ref 26.0–34.0)
MCHC: 35.4 g/dL (ref 30.0–36.0)
Platelets: 248 10*3/uL (ref 150–400)
RBC: 5.35 MIL/uL (ref 4.22–5.81)

## 2013-07-23 LAB — LIPID PANEL
Cholesterol: 166 mg/dL (ref 0–200)
HDL: 42 mg/dL (ref >39)
LDL Cholesterol: 110 mg/dL — ABNORMAL HIGH (ref 0–99)
Total CHOL/HDL Ratio: 4 Ratio
Triglycerides: 70 mg/dL (ref <150)
VLDL: 14 mg/dL (ref 0–40)

## 2013-07-23 MED ORDER — MONTELUKAST SODIUM 10 MG PO TABS: 10.0000 mg | ORAL_TABLET | Freq: Every day | ORAL | Status: DC

## 2013-07-23 MED ORDER — LOSARTAN POTASSIUM 50 MG PO TABS: 50.0000 mg | ORAL_TABLET | Freq: Every day | ORAL | Status: DC

## 2013-07-23 MED ORDER — LEVOCETIRIZINE DIHYDROCHLORIDE 5 MG PO TABS: 5.0000 mg | ORAL_TABLET | Freq: Every evening | ORAL | Status: DC

## 2013-07-23 NOTE — Progress Notes (Signed)
  Subjective:    Patient ID: Christopher Bolton, male    DOB: Mar 23, 1961, 52 y.o.   MRN: [id number]  HPI    Review of Systems  Constitutional: Negative.   HENT: Negative.   Eyes: Negative.   Respiratory: Negative.   Cardiovascular: Negative.   Gastrointestinal: Negative.   Endocrine: Negative.   Genitourinary: Negative.   Musculoskeletal: Negative.   Skin: Negative.   Allergic/Immunologic: Negative.   Neurological: Negative.   Hematological: Negative.   Psychiatric/Behavioral: Negative.    he has a tremor of the upper extremities. This has been present for approximately one year. It seems to be worse in the right arm. He has known cervical multilevel disc disease in his neck. He intermittently has neck discomfort. He may be interested in seeing a specialist at sometime in the future     Objective:   Physical Exam HEENT exam is unremarkable. Neck is supple. Chest is clear to auscultation and percussion. Heart regular rate no murmurs rubs or gallops appreciated. The abdomen is soft the liver and spleen are not enlarged there are no areas of tenderness and no masses are felt . Neurologically patient has a fine tremor of the upper 70s. Otherwise his exam is normal.       Assessment & Plan:  Referral to be made to neurology to evaluate upper extremity tremor. If the evaluation is negative we'll consider orthopedic evaluation of his cervical disease

## 2013-08-01 ENCOUNTER — Encounter: Payer: Self-pay | Admitting: Neurology

## 2013-08-01 ENCOUNTER — Ambulatory Visit (INDEPENDENT_AMBULATORY_CARE_PROVIDER_SITE_OTHER): Payer: 59 | Admitting: Neurology

## 2013-08-01 VITALS — BP 130/78 | HR 72 | Temp 97.9°F | Resp 12 | Wt 220.8 lb

## 2013-08-01 DIAGNOSIS — H532 Diplopia: Secondary | ICD-10-CM

## 2013-08-01 DIAGNOSIS — G250 Essential tremor: Secondary | ICD-10-CM

## 2013-08-01 DIAGNOSIS — M542 Cervicalgia: Secondary | ICD-10-CM

## 2013-08-01 DIAGNOSIS — R251 Tremor, unspecified: Secondary | ICD-10-CM

## 2013-08-01 NOTE — Patient Instructions (Addendum)
1.  Your MRI is scheduled for Wednesday October 1st at 8:00am at [facility]. Please arrive at 7:45am and enter through Entrance A. [phone].  Please schedule a EMG (rt upper extremity) appointment with Dr. Allena Katz and a 6 week follow up with Dr. Arbutus Leas on your way out today. Thank you!

## 2013-08-01 NOTE — Progress Notes (Signed)
Subjective:   Christopher Bolton was seen in consultation in the movement disorder clinic at the request of DAUB, STEVE A, MD.  The evaluation is for tremor.  The patient is a 52 y.o. right handed male with a history of tremor since April or May 2014, and it is bilateral.   The tremor is only with action or with holding a weight.  He originally associated it with degenerative changes in the neck but now isn't quite sure.  It is worse when he has neck pain.  There is no family hx of tremor.  The pt is currently on adderall and has been on it 6 years.  He takes it 5 days per week.   Tremor is improved on the weekend; he doesn't take adderrall on the weekend but also is not at work on the weekend and is clear to say that tremor is affected by neck pain, which is worse at work.  Affected by caffeine:  no Affected by alcohol:  unknown Affected by stress:  unknown Affected by fatigue:  yes Spills soup if on spoon:  no Spills glass of liquid if full:  no Affects ADL's (tying shoes, brushing teeth, etc):  no  Current/Previously tried tremor medications: n/a  Current medications that may exacerbate tremor:  Adderall, singulair (only takes seasonally)  I reviewed the patient's 2012 MRI of the cervical spine with the patient in the office today.  There were degenerative changes and disc protrusion, most significant at C6-C7.  *RADIOLOGY REPORT*  Clinical Data: Neck pain. Parasthesias. Right upper extremity  numbness and tingling. Some the left arm pain as well.  MRI CERVICAL SPINE WITHOUT CONTRAST  Technique: Multiplanar and multiecho pulse sequences of the  cervical spine, to include the craniocervical junction and  cervicothoracic junction, were obtained according to standard  protocol without intravenous contrast.  Comparison: None.  Findings: Normal signal is present in the cervical and upper  thoracic spinal cord to the lowest imaged level, T2-3. Marrow  signal is normal. Slight degenerative  retrolisthesis is present C5-  6 and C6-7. There is straightening of the normal cervical  lordosis. The craniocervical junction is within normal limits.  The visualized intracranial structures are unremarkable.  C2-3: No significant disc herniation or stenosis is present.  C3-4: A mild broad-based disc osteophyte complex is present.  There is partial effacement of the ventral CSF. Mild uncovertebral  spurring is seen bilaterally without significant foraminal  stenosis.  C4-5: A broad-based disc osteophyte complex is present. There is  mild effacement of the ventral CSF. Mild foraminal narrowing is  worse on the right.  C5-6: A broad-based disc osteophyte complex is present.  Asymmetric right-sided uncovertebral spurring is noted. Mild right  facet hypertrophy is seen as well. This results in moderate to  severe right foraminal stenosis. Mild central canal narrowing is  present. The left foramen is patent.  C6-7: A broad-based disc osteophyte complex partially effaces the  ventral CSF. Bilateral uncovertebral spurring results in moderate  foraminal stenosis bilaterally. Facet hypertrophy contributes.  C7-T1: No significant disc herniation or stenosis is present.  IMPRESSION:  1. Mild uncovertebral disease bilaterally at C3-4 without  significant stenosis.  2. Mild foraminal narrowing at C4-5 is worse on the right.  3. Moderate to severe right foraminal stenosis at C5-6.  4. Mild right-sided central canal stenosis at C5-6.  5. Moderate foraminal stenosis bilaterally at C6-7.   Outside reports reviewed: historical medical records.  No Known Allergies  Current Outpatient Prescriptions on File  Prior to Visit  Medication Sig Dispense Refill  . amphetamine-dextroamphetamine (ADDERALL XR) 10 MG 24 hr capsule Take 10 mg by mouth every morning. Pt is on a  Staggered dosage.  Taking 5 tablets beginning on Monday, tapering down 1 tablet daily      . buPROPion (WELLBUTRIN XL) 150 MG 24 hr  tablet Take 450 mg by mouth daily.      Marland Kitchen levocetirizine (XYZAL) 5 MG tablet Take 1 tablet (5 mg total) by mouth every evening.  90 tablet  3  . losartan (COZAAR) 50 MG tablet Take 1 tablet (50 mg total) by mouth daily.  90 tablet  3  . montelukast (SINGULAIR) 10 MG tablet Take 1 tablet (10 mg total) by mouth at bedtime.  90 tablet  3  . albuterol (PROVENTIL HFA;VENTOLIN HFA) 108 (90 BASE) MCG/ACT inhaler Inhale 2 puffs into the lungs every 4 (four) hours as needed.       No current facility-administered medications on file prior to visit.    Past Medical History  Diagnosis Date  . Allergy   . Depression   . Asthma   . Hypertension   . Ureteral stone   . Dysuria-frequency syndrome   . History of nausea     related to stone  . Degenerative joint disease 2012    Past Surgical History  Procedure Laterality Date  . Prk Left   . Cholecystectomy    . Cystoscopy/retrograde/ureteroscopy  10/08/2012    Procedure: CYSTOSCOPY/RETROGRADE/URETEROSCOPY;  Surgeon: Sebastian Ache, MD;  Location: [facility];  Service: Urology;  Laterality: Left;  stent   . Trigger finger release    . Cystoscopy with retrograde pyelogram, ureteroscopy and stent placement  10/27/2012    Procedure: CYSTOSCOPY WITH RETROGRADE PYELOGRAM, URETEROSCOPY AND STENT PLACEMENT;  Surgeon: Sebastian Ache, MD;  Location: [facility];  Service: Urology;  Laterality: Left;  . Holmium laser application  10/27/2012    Procedure: HOLMIUM LASER APPLICATION;  Surgeon: Sebastian Ache, MD;  Location: [facility];  Service: Urology;  Laterality: Left;  . Cystoscopy w/ ureteral stent removal  10/27/2012    Procedure: CYSTOSCOPY WITH STENT REMOVAL;  Surgeon: Sebastian Ache, MD;  Location: [facility];  Service: Urology;  Laterality: Left;    History   Social History  . Marital Status: Married    Spouse Name: N/A    Number of Children: N/A  . Years of Education: N/A   Occupational History  .  system analyst [employer]   Social History Main Topics  . Smoking status: Never Smoker   . Smokeless tobacco: Never Used  . Alcohol Use: Yes     Comment: occasionally - 1-3 beers per week  . Drug Use: No  . Sexual Activity: Yes     Comment: number of sex partners in the last 12 months 1   Other Topics Concern  . Not on file   Social History Narrative  . No narrative on file    Family Status  Relation Status Death Age  . Mother Alive     healthy  . Father Alive     healthy  . Sister Alive     healthy  . Maternal Grandmother Deceased   . Maternal Grandfather Deceased   . Paternal Grandmother Deceased   . Paternal Grandfather Deceased     Review of Systems A complete 10 system ROS was obtained and was negative apart from what is mentioned.   Objective:   VITALS:  Filed Vitals:   08/01/13 0807  BP: 130/78  Pulse: 72  Temp: 97.9 F (36.6 C)  Resp: 12  Weight: 220 lb 12.8 oz (100.154 kg)   Gen:  Appears stated age and in NAD. HEENT:  Normocephalic, atraumatic. The mucous membranes are moist. The superficial temporal arteries are without ropiness or tenderness. Cardiovascular: Regular rate and rhythm. Lungs: Clear to auscultation bilaterally. Neck: There are no carotid bruits noted bilaterally.  NEUROLOGICAL:  Orientation:  The patient is alert and oriented x 3.  Recent and remote memory are intact.  Attention span and concentration are normal.  Able to name objects and repeat without trouble.  Fund of knowledge is appropriate Cranial nerves: There is good facial symmetry.  There is asymmetric blink on the left.  The pupils are equal round and reactive to light bilaterally. Fundoscopic exam reveals clear disc margins bilaterally. Extraocular muscles are intact and visual fields are full to confrontational testing. Speech is fluent and clear. Soft palate rises symmetrically and there is no tongue deviation. Hearing is intact to conversational tone. Tone: Tone  is good throughout. Sensation: Sensation is intact to light touch and pinprick throughout (facial, trunk, extremities). Vibration is intact at the bilateral big toe. There is no extinction with double simultaneous stimulation. There is no sensory dermatomal level identified. Coordination:  The patient has no dysdiadichokinesia or dysmetria. Motor: Strength is 5/5 in the bilateral upper and lower extremities.  Shoulder shrug is equal bilaterally.  There is no pronator drift.  There are no fasciculations noted. DTR's: Deep tendon reflexes are 1/4 at the bilateral biceps, triceps, brachioradialis, patella and achilles.  Plantar responses are downgoing bilaterally. Gait and Station: The patient is able to ambulate without difficulty. The patient is able to heel toe walk without any difficulty. The patient is able to ambulate in a tandem fashion. The patient is able to stand in the Romberg position.   MOVEMENT EXAM: Tremor:  There is very minimal tremor of the outstretched hands.  It does not change with intention.  He has no difficulty drawing Archimedes spirals.  He has no difficulty pouring water from one glass to another.      Assessment/Plan:   1.  tremor.  -I suspect that this is an enhanced physiologic tremor, worsened by the Adderall that he takes during the work week.  -I do not think that the tremor is significant enough to require any medication. 2.  neck pain, radiating down the right arm.  -He will have an EMG.  -He will have an updated MRI of the cervical spine. 3.  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  He does have a prisms in his glasses.  He does have asymmetric blink and diplopia, so I am going to go ahead and draw an acetylcholine receptor antibody, but I do think that myasthenia is very low on the differential diagnosis.  I saw no ptosis. 4.  followup with me will be after the above has been completed.

## 2013-08-08 ENCOUNTER — Ambulatory Visit (HOSPITAL_COMMUNITY)
Admission: RE | Admit: 2013-08-08 | Discharge: 2013-08-08 | Disposition: A | Discharge status: Home / Self Care | Payer: 59 | Source: Ambulatory Visit | Attending: Neurology | Admitting: Neurology

## 2013-08-08 DIAGNOSIS — M542 Cervicalgia: Secondary | ICD-10-CM

## 2013-08-08 DIAGNOSIS — R251 Tremor, unspecified: Secondary | ICD-10-CM

## 2013-08-08 DIAGNOSIS — M79609 Pain in unspecified limb: Secondary | ICD-10-CM | POA: Insufficient documentation

## 2013-08-08 DIAGNOSIS — M5380 Other specified dorsopathies, site unspecified: Secondary | ICD-10-CM | POA: Insufficient documentation

## 2013-08-08 DIAGNOSIS — M5020 Other cervical disc displacement, unspecified cervical region: Secondary | ICD-10-CM | POA: Insufficient documentation

## 2013-08-16 ENCOUNTER — Ambulatory Visit (INDEPENDENT_AMBULATORY_CARE_PROVIDER_SITE_OTHER): Payer: 59 | Admitting: Neurology

## 2013-08-16 ENCOUNTER — Encounter: Payer: Self-pay | Admitting: Neurology

## 2013-08-16 DIAGNOSIS — M5010 Cervical disc disorder with radiculopathy, unspecified cervical region: Secondary | ICD-10-CM

## 2013-08-16 DIAGNOSIS — M5412 Radiculopathy, cervical region: Secondary | ICD-10-CM

## 2013-08-16 DIAGNOSIS — M79609 Pain in unspecified limb: Secondary | ICD-10-CM

## 2013-08-16 NOTE — Procedures (Signed)
[facility]  [address] [place], [address]  [place], [place] [address] Tel: [phone] Fax:  [phone] Test Date:  08/16/2013  Patient: Christopher Bolton DOB: 09/02/1961 Physician: Nita Sickle, DO  Sex: Male Height: 5\' 8"  Ref Phys: Kerin Salen  ID#: [id number] Temp: 32.5C Technician:    Patient Complaints: 52 year-old man with neck pain radiating into his arms.  NCV & EMG Findings: Extensive evaluation of the right upper extremity and additional studies of the left shows: 1. Motor and sensory responses of the right arm. 2. Needle electrode examination shows chronic motor axon loss changes affecting C8 > C6 -myotomes without active denervation. Similar changes are seen in the left side.  Impression: These findings are consistent with an old intraspinal canal lesion (i.e. radiculopathy) affecting the C8 > C6 nerve roots/segments bilaterally, moderate in degree electrically at C8, and very mild at C6 level.   ___________________________ Nita Sickle, DO    Nerve Conduction Studies Anti Sensory Summary Table   Site NR Peak (ms) Norm Peak (ms) P-T Amp (V) Norm P-T Amp  Right Median Anti Sensory (2nd Digit)  Wrist    3.3 <3.6 27.5 >15  Right Radial Anti Sensory (Base 1st Digit)  Wrist    2.3 <2.7 19.2 >14  Right Ulnar Anti Sensory (5th Digit)  Wrist    2.9 <3.1 38.9 >10   Motor Summary Table   Site NR Onset (ms) Norm Onset (ms) O-P Amp (mV) Norm O-P Amp Site1 Site2 Delta-0 (ms) Dist (cm) Vel (m/s) Norm Vel (m/s)  Right Median Motor (Abd Poll Brev)  Wrist    3.2 <4.0 14.5 >6 Elbow Wrist 5.1 29.0 57 >50  Elbow    8.3  13.6         Right Ulnar Motor (Abd Dig Minimi)  Wrist    2.4 <3.1 12.2 >7 B Elbow Wrist 3.6 23.0 64 >50  B Elbow    6.0  11.2  A Elbow B Elbow 2.0 10.0 50 >50  A Elbow    8.0  11.2          EMG   Side Muscle Ins Act Fibs Psw Fasc Number Recrt Dur Dur. Amp Amp. Poly Poly. Comment  Right 1stDorInt 1+ Nml Nml Nml 1- Mod-R Some 1+ Nml Nml Few 1+ N/A   Right FlexPolLong Nml Nml Nml Nml 1- Mod-R Some 1+ Nml Nml Few 1+ N/A  Right Ext Indicis Nml Nml Nml Nml Nml Nml Nml Nml Nml Nml Nml Nml N/A  Right PronatorTeres Nml Nml Nml Nml 1- Mod Few 1+ Nml Nml Nml Nml N/A  Right Biceps Nml Nml Nml Nml Nml Nml Few 1+ Nml Nml Nml Nml N/A  Right Triceps Nml Nml Nml Nml 3- Mod-R Many 1+ Many 1+ Few 1+ N/A  Right Deltoid Nml Nml Nml Nml Nml Nml Nml Nml Nml Nml Nml Nml N/A  Right Cervical Parasp Low Nml Nml Nml Nml Nml Nml Nml Nml Nml Nml Nml Nml N/A  Left 1stDorInt Nml Nml Nml Nml 1- Mod-R Some 1+ Nml Nml Some 1+ N/A  Left Triceps Nml Nml Nml Nml 2- Rapid Many 1+ Nml Nml Many 1+ N/A  Left PronatorTeres Nml Nml Nml Nml Nml Nml Nml Nml Nml Nml Nml Nml N/A      Waveforms:

## 2013-08-16 NOTE — Progress Notes (Signed)
See procedure note for EMG results.  Donika K. Patel, DO  

## 2013-08-23 ENCOUNTER — Other Ambulatory Visit: Payer: Self-pay

## 2013-08-23 ENCOUNTER — Ambulatory Visit (INDEPENDENT_AMBULATORY_CARE_PROVIDER_SITE_OTHER): Payer: 59 | Admitting: Neurology

## 2013-08-23 VITALS — BP 130/88 | HR 70 | Temp 98.0°F | Resp 14 | Ht 69.0 in | Wt 223.3 lb

## 2013-08-23 DIAGNOSIS — M5412 Radiculopathy, cervical region: Secondary | ICD-10-CM

## 2013-08-23 DIAGNOSIS — M5010 Cervical disc disorder with radiculopathy, unspecified cervical region: Secondary | ICD-10-CM

## 2013-08-23 NOTE — Progress Notes (Signed)
Subjective:   Christopher Bolton was seen in consultation in the movement disorder clinic at the request of DAUB, STEVE A, MD.  The evaluation is for tremor.  The patient is following up today regarding neck pain.  He is following up for testing results.  He had an MRI of the cervical spine since our last visit.  There were no significant changes over the last 2 years, but there continues to be moderately severe right neural foraminal stenosis at C5-C6 level and moderate neural foraminal stenosis bilaterally at C6-C7 levels.  He reports neck pain that radiates down both arms.  It has gotten worse recently.  He is not doing anything that he knows that aggravates it.  He had an EMG that demonstrated a chronic C8 greater than C6 radiculopathy.  He has no diplopia.  He denies speech changes.  He has good control of her bladder and bowel.  He works full time.  He works as a Radio producer.  His full MRI of the cervical spine report done recently is below:  MRI CERVICAL SPINE WITHOUT CONTRAST  TECHNIQUE:  Multiplanar, multisequence MR imaging was performed. No intravenous  contrast was administered.  COMPARISON: Cervical MRI 09/16/2011.  FINDINGS:  The alignment is stable with a minimal retrolisthesis at C5-6 and  C6-7. There is no evidence of fracture or paraspinal abnormality.  The craniocervical junction appears normal. The cervical cord is  normal in signal and caliber. There are bilateral vertebral artery  flow voids.  There are no significant findings at C1-2 or C2-3.  C3-4: There is stable disc bulging with bilateral uncinate spurring  contributing to minimal biforaminal stenosis. No cord deformity.  C4-5: There is stable disc bulging with a tiny central disc  protrusion and mild uncinate spurring bilaterally. The resulting  mild right-greater-than-left foraminal narrowing appears unchanged.  No cord deformity.  C5-6: There is a stable small central disc protrusion without cord  deformity. An  asymmetric right foraminal disc osteophyte complex  causes moderate to severe right foraminal stenosis, similar to the  prior study. Right C6 nerve root encroachment is likely. The left  foramen appears adequately patent.  C6-7: There is a broad-based central disc protrusion with moderate  uncinate spurring bilaterally. The AP diameter of the canal is 9 mm  without associated cord deformity. Moderate lateral is similar to  the prior study.  IMPRESSION:  1. Overall findings are similar to the prior study from  approximately 2 years ago.  2. At C5-6, there is moderate to severe right foraminal stenosis  secondary to a disc osteophyte complex. This likely contributes to  right C6 nerve root encroachment.  3. At C6-7, there is a broad-based central disc protrusion with  bilateral uncinate spurring contributing to mild central and  moderate foraminal stenosis bilaterally. Exiting nerve root  encroachment at this level is possible.  4. No other significant spinal stenosis or nerve root encroachment.    Outside reports reviewed: historical medical records.  No Known Allergies  Current Outpatient Prescriptions on File Prior to Visit  Medication Sig Dispense Refill  . amphetamine-dextroamphetamine (ADDERALL XR) 10 MG 24 hr capsule Take 10 mg by mouth every morning. Pt is on a  Staggered dosage.  Taking 5 tablets beginning on Monday, tapering down 1 tablet daily      . buPROPion (WELLBUTRIN XL) 150 MG 24 hr tablet Take 450 mg by mouth daily.      Marland Kitchen levocetirizine (XYZAL) 5 MG tablet Take 1 tablet (5 mg total) by  mouth every evening.  90 tablet  3  . losartan (COZAAR) 50 MG tablet Take 1 tablet (50 mg total) by mouth daily.  90 tablet  3  . montelukast (SINGULAIR) 10 MG tablet Take 1 tablet (10 mg total) by mouth at bedtime.  90 tablet  3  . albuterol (PROVENTIL HFA;VENTOLIN HFA) 108 (90 BASE) MCG/ACT inhaler Inhale 2 puffs into the lungs every 4 (four) hours as needed.       No current  facility-administered medications on file prior to visit.    Past Medical History  Diagnosis Date  . Allergy   . Depression   . Asthma   . Hypertension   . Ureteral stone   . Dysuria-frequency syndrome   . History of nausea     related to stone  . Degenerative joint disease 2012    Past Surgical History  Procedure Laterality Date  . Prk Left   . Cholecystectomy    . Cystoscopy/retrograde/ureteroscopy  10/08/2012    Procedure: CYSTOSCOPY/RETROGRADE/URETEROSCOPY;  Surgeon: Sebastian Ache, MD;  Location: [facility];  Service: Urology;  Laterality: Left;  stent   . Trigger finger release    . Cystoscopy with retrograde pyelogram, ureteroscopy and stent placement  10/27/2012    Procedure: CYSTOSCOPY WITH RETROGRADE PYELOGRAM, URETEROSCOPY AND STENT PLACEMENT;  Surgeon: Sebastian Ache, MD;  Location: [facility];  Service: Urology;  Laterality: Left;  . Holmium laser application  10/27/2012    Procedure: HOLMIUM LASER APPLICATION;  Surgeon: Sebastian Ache, MD;  Location: [facility];  Service: Urology;  Laterality: Left;  . Cystoscopy w/ ureteral stent removal  10/27/2012    Procedure: CYSTOSCOPY WITH STENT REMOVAL;  Surgeon: Sebastian Ache, MD;  Location: [facility];  Service: Urology;  Laterality: Left;    History   Social History  . Marital Status: Married    Spouse Name: N/A    Number of Children: N/A  . Years of Education: N/A   Occupational History  . system analyst [employer]   Social History Main Topics  . Smoking status: Never Smoker   . Smokeless tobacco: Never Used  . Alcohol Use: Yes     Comment: occasionally - 1-3 beers per week  . Drug Use: No  . Sexual Activity: Yes     Comment: number of sex partners in the last 12 months 1   Other Topics Concern  . Not on file   Social History Narrative  . No narrative on file    Family Status  Relation Status Death Age  . Mother Alive     healthy  . Father Alive      healthy  . Sister Alive     healthy  . Maternal Grandmother Deceased   . Maternal Grandfather Deceased   . Paternal Grandmother Deceased   . Paternal Grandfather Deceased     Review of Systems A complete 10 system ROS was obtained and was negative apart from what is mentioned.   Objective:   VITALS:   Filed Vitals:   08/23/13 0903  BP: 130/88  Pulse: 70  Temp: 98 F (36.7 C)  Resp: 14  Height: 5\' 9"  (1.753 m)  Weight: 223 lb 4.8 oz (101.288 kg)   Gen:  Appears stated age and in NAD. HEENT:  Normocephalic, atraumatic. The mucous membranes are moist. The superficial temporal arteries are without ropiness or tenderness. Cardiovascular: Regular rate and rhythm. Lungs: Clear to auscultation bilaterally. Neck: There are no carotid bruits noted bilaterally.  NEUROLOGICAL:  Orientation:  The patient is alert and oriented x 3.  Recent and remote memory are intact.  Attention span and concentration are normal.  Able to name objects and repeat without trouble.  Fund of knowledge is appropriate Cranial nerves: There is good facial symmetry.  There is asymmetric blink on the left.  . Extraocular muscles are intact and visual fields are full to confrontational testing. Speech is fluent and clear. Soft palate rises symmetrically and there is no tongue deviation. Hearing is intact to conversational tone. Tone: Tone is good throughout. Sensation: Sensation is intact to light touch and pinprick throughout (facial, trunk, extremities). Vibration is intact at the bilateral big toe. There is no extinction with double simultaneous stimulation. There is no sensory dermatomal level identified. Coordination:  The patient has no dysdiadichokinesia or dysmetria. Motor: Strength is 5/5 in the bilateral upper and lower extremities.  Shoulder shrug is equal bilaterally.  There is no pronator drift.  There are no fasciculations noted. DTR's: Not tested today. Gait and Station: The patient is able to  ambulate without difficulty.   MOVEMENT EXAM: Tremor:  There is very minimal tremor of the outstretched hands.  It does not change with intention.  He has no difficulty drawing Archimedes spirals.  He has no difficulty pouring water from one glass to another.      Assessment/Plan:   1.  cervical radiculopathy.  -Greater than 50% a 25 minute visit was spent in counseling.  Patient had multiple questions and I answered them to the best of my ability.  We talked about various treatment options, to include pain management/injections as well as a neurosurgical consult.  We ultimately decided to get a consultation with Dr. Venetia Maxon. 2.  he will follow up here on an as-needed basis.

## 2013-09-13 ENCOUNTER — Other Ambulatory Visit: Payer: Self-pay

## 2013-09-14 ENCOUNTER — Ambulatory Visit: Payer: 59 | Admitting: Neurology

## 2013-09-19 ENCOUNTER — Encounter: Payer: Self-pay | Admitting: Emergency Medicine

## 2014-01-22 ENCOUNTER — Encounter: Payer: Self-pay | Admitting: Emergency Medicine

## 2014-01-22 ENCOUNTER — Ambulatory Visit (INDEPENDENT_AMBULATORY_CARE_PROVIDER_SITE_OTHER): Payer: 59 | Admitting: Emergency Medicine

## 2014-01-22 VITALS — BP 130/81 | HR 83 | Temp 98.2°F | Resp 16 | Ht 68.0 in | Wt 223.0 lb

## 2014-01-22 DIAGNOSIS — Z9109 Other allergy status, other than to drugs and biological substances: Secondary | ICD-10-CM

## 2014-01-22 DIAGNOSIS — M5090 Cervical disc disorder, unspecified, unspecified cervical region: Secondary | ICD-10-CM

## 2014-01-22 DIAGNOSIS — J309 Allergic rhinitis, unspecified: Secondary | ICD-10-CM

## 2014-01-22 DIAGNOSIS — I10 Essential (primary) hypertension: Secondary | ICD-10-CM

## 2014-01-22 MED ORDER — LEVOCETIRIZINE DIHYDROCHLORIDE 5 MG PO TABS: 5.0000 mg | ORAL_TABLET | Freq: Every evening | ORAL | Status: DC

## 2014-01-22 MED ORDER — LOSARTAN POTASSIUM 50 MG PO TABS: 50.0000 mg | ORAL_TABLET | Freq: Every day | ORAL | Status: DC

## 2014-01-22 MED ORDER — METHYLPREDNISOLONE ACETATE 80 MG/ML IJ SUSP
80.0000 mg | Freq: Once | INTRAMUSCULAR | Status: AC
  Administered 2014-01-22: 80 mg via INTRAMUSCULAR

## 2014-01-22 MED ORDER — MONTELUKAST SODIUM 10 MG PO TABS: 10.0000 mg | ORAL_TABLET | Freq: Every day | ORAL | Status: DC

## 2014-01-22 NOTE — Progress Notes (Signed)
  This chart was scribed for Darlyne Russian, MD by Eston Mould, ED Scribe. This patient was seen in room Room/bed 22 and the patient's care was started at 8:37 AM. Subjective:    Patient ID: Christopher Bolton, male    DOB: May 30, 1961, 53 y.o.   MRN: [id number]  HPI Christopher Bolton is a 53 y.o. male who presents to the [facility] for F/U apt and medication refill. Pt states he is doing well. He states he felt he had "two rusted nails driven into his shoulders" prior to surgery and reports pain has subsided. Pt states he does having bilateral numbness to fingers. He states when he woke up from his surgery, the numbness to his fingers was intense but states "the pain has dissipated". Pt was diagnosed 08/23/2013 with cervical disc disease by Dr. Wells Guiles Tat, neurologist. His surgery was October 23, 2013.   Pt states "he is getting geared up for allergy season" which will cause his allergies to flare in the next 2 weeks. He states he generally has trouble with his allergies during the month of May. He has requested to get a shot of DepoMedrol. Pt states with the shot, his allergies become tolerable. Pt states he was informed by his cardiologist he "is good for the shot". Pt has requested a refill of his Cozaar, Singular, and Xyzal.  Dr. Robina Ade prescribes patients Wellbutrin and Adderall, with whom he has an apt with in 2 weeks. He will return for his F/U in 33-months. 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 Review of Systems  Allergic/Immunologic: Positive for environmental allergies (seasonal allergies).  Neurological: Positive for numbness.   Objective:   Physical Exam CONSTITUTIONAL: Well developed/well nourished HEAD: Normocephalic/atraumatic EYES: EOMI/PERRL ENMT: Mucous membranes moist NECK: supple no meningeal signs. Healed scar to L side of neck. SPINE:entire spine nontender CV: S1/S2 noted, no murmurs/rubs/gallops noted LUNGS: Lungs are clear to auscultation bilaterally, no apparent  distress ABDOMEN: soft, nontender, no rebound or guarding GU:no cva tenderness NEURO: Pt is awake/alert, moves all extremitiesx4 EXTREMITIES: pulses normal, full ROM SKIN: warm, color normal PSYCH: no abnormalities of mood noted  Assessment & Plan:   Blood pressure is under good control. He has recovered well from his neck surgery. He was given 80 of Depo-Medrol prior to the allergy season. He has done this in the past with good results. Will recheck 6 months for his physical at that time.

## 2014-03-12 ENCOUNTER — Other Ambulatory Visit: Payer: Self-pay | Admitting: Family Medicine

## 2014-07-23 ENCOUNTER — Ambulatory Visit (INDEPENDENT_AMBULATORY_CARE_PROVIDER_SITE_OTHER): Payer: 59 | Admitting: Emergency Medicine

## 2014-07-23 ENCOUNTER — Ambulatory Visit (INDEPENDENT_AMBULATORY_CARE_PROVIDER_SITE_OTHER): Payer: 59

## 2014-07-23 ENCOUNTER — Encounter: Payer: Self-pay | Admitting: Emergency Medicine

## 2014-07-23 VITALS — BP 128/90 | HR 84 | Temp 98.3°F | Resp 16 | Ht 68.25 in | Wt 223.2 lb

## 2014-07-23 DIAGNOSIS — I10 Essential (primary) hypertension: Secondary | ICD-10-CM

## 2014-07-23 DIAGNOSIS — R200 Anesthesia of skin: Secondary | ICD-10-CM

## 2014-07-23 DIAGNOSIS — Z131 Encounter for screening for diabetes mellitus: Secondary | ICD-10-CM

## 2014-07-23 DIAGNOSIS — N529 Male erectile dysfunction, unspecified: Secondary | ICD-10-CM

## 2014-07-23 DIAGNOSIS — R05 Cough: Secondary | ICD-10-CM

## 2014-07-23 DIAGNOSIS — R209 Unspecified disturbances of skin sensation: Secondary | ICD-10-CM

## 2014-07-23 DIAGNOSIS — R059 Cough, unspecified: Secondary | ICD-10-CM

## 2014-07-23 DIAGNOSIS — Z0000 Encounter for general adult medical examination without abnormal findings: Secondary | ICD-10-CM

## 2014-07-23 DIAGNOSIS — J309 Allergic rhinitis, unspecified: Secondary | ICD-10-CM

## 2014-07-23 DIAGNOSIS — N528 Other male erectile dysfunction: Secondary | ICD-10-CM

## 2014-07-23 DIAGNOSIS — Z1211 Encounter for screening for malignant neoplasm of colon: Secondary | ICD-10-CM

## 2014-07-23 LAB — CBC WITH DIFFERENTIAL/PLATELET
Basophils Absolute: 0 10*3/uL (ref 0.0–0.1)
Basophils Relative: 0 % (ref 0–1)
EOS ABS: 0.1 10*3/uL (ref 0.0–0.7)
Eosinophils Relative: 2 % (ref 0–5)
HEMATOCRIT: 48.3 % (ref 39.0–52.0)
Hemoglobin: 16.8 g/dL (ref 13.0–17.0)
LYMPHS ABS: 2.1 10*3/uL (ref 0.7–4.0)
LYMPHS PCT: 35 % (ref 12–46)
MCH: 29.8 pg (ref 26.0–34.0)
MCHC: 34.8 g/dL (ref 30.0–36.0)
MCV: 85.6 fL (ref 78.0–100.0)
MONO ABS: 0.5 10*3/uL (ref 0.1–1.0)
Monocytes Relative: 8 % (ref 3–12)
Neutro Abs: 3.3 10*3/uL (ref 1.7–7.7)
Neutrophils Relative %: 55 % (ref 43–77)
PLATELETS: 271 10*3/uL (ref 150–400)
RBC: 5.64 MIL/uL (ref 4.22–5.81)
RDW: 13.1 % (ref 11.5–15.5)
WBC: 6 10*3/uL (ref 4.0–10.5)

## 2014-07-23 LAB — COMPLETE METABOLIC PANEL WITH GFR
ALT: 31 U/L (ref 0–53)
AST: 23 U/L (ref 0–37)
Albumin: 4.4 g/dL (ref 3.5–5.2)
Alkaline Phosphatase: 95 U/L (ref 39–117)
BILIRUBIN TOTAL: 1.2 mg/dL (ref 0.2–1.2)
BUN: 12 mg/dL (ref 6–23)
CALCIUM: 9 mg/dL (ref 8.4–10.5)
CHLORIDE: 102 meq/L (ref 96–112)
CO2: 27 meq/L (ref 19–32)
CREATININE: 1.16 mg/dL (ref 0.50–1.35)
GFR, EST AFRICAN AMERICAN: 83 mL/min
GFR, EST NON AFRICAN AMERICAN: 71 mL/min
GLUCOSE: 98 mg/dL (ref 70–99)
Potassium: 4.3 mEq/L (ref 3.5–5.3)
Sodium: 140 mEq/L (ref 135–145)
Total Protein: 6.5 g/dL (ref 6.0–8.3)

## 2014-07-23 LAB — LIPID PANEL
Cholesterol: 170 mg/dL (ref 0–200)
HDL: 44 mg/dL (ref >39)
LDL Cholesterol: 109 mg/dL — ABNORMAL HIGH (ref 0–99)
TRIGLYCERIDES: 83 mg/dL (ref <150)
Total CHOL/HDL Ratio: 3.9 Ratio
VLDL: 17 mg/dL (ref 0–40)

## 2014-07-23 LAB — POCT URINALYSIS DIPSTICK
BILIRUBIN UA: NEGATIVE
Glucose, UA: NEGATIVE
KETONES UA: NEGATIVE
Nitrite, UA: NEGATIVE
Protein, UA: NEGATIVE
RBC UA: NEGATIVE
Spec Grav, UA: <=1.005
Urobilinogen, UA: 0.2
pH, UA: 5.5

## 2014-07-23 MED ORDER — LOSARTAN POTASSIUM 50 MG PO TABS: 50.0000 mg | ORAL_TABLET | Freq: Every day | ORAL | Status: DC

## 2014-07-23 MED ORDER — GABAPENTIN 300 MG PO CAPS: ORAL_CAPSULE | ORAL | Status: DC

## 2014-07-23 MED ORDER — SILDENAFIL CITRATE 100 MG PO TABS: 50.0000 mg | ORAL_TABLET | Freq: Every day | ORAL | Status: DC | PRN

## 2014-07-23 NOTE — Progress Notes (Addendum)
This chart was scribed for Darlyne Russian, MD by Einar Pheasant, ED Scribe. This patient was seen in room 21 and the patient's care was started at 8:04 AM.  Subjective:    Patient ID: Christopher Bolton, male    DOB: January 12, 1961, 53 y.o.   MRN: [id number]  Chief Complaint  Patient presents with  . Annual Exam    HPI Christopher Bolton is a 53 y.o. male with a hx of depression, DDD, HTN, and allergic rhinitis. Pt was seen las time for hypertension, tremor, and allergies. Gave pt a referral to neurology for the tremors and ENG and an MRI was done. Pt was prescribed Adderall by past medical provider.   Pt is in the office today for his annual physical exam. Christopher Bolton states that he is doing well today.    Tremors: Pt states that the tremors are doing a little better. He states that he has noticed that when he gets agitated or anxious the symptoms seem to worsen.  DDD: Pt states that he had a surgical neck procedure done by Dr. Vertell Limber, who said that he is recovering well. He states that the pain in his right shoulder is gone, however, he has lost some sensation to the tips of his right thumb and right index finger and mild loss of strength on his right side, but nothing to cause concern. Pt states that Dr. Vertell Limber prescribed him some muscle relaxants because he always felt like there was a crick in his neck. However, the discomfort in his neck has not subsided and he would like to follow a new regiment plan. Proposed to pt to try Neurontin but he does endorse sleep disturbances or "weird dreams" (seeing cartoon figures) in the past. However, pt is still willing to try this course of treatment. He states that what he noticed in the past is that the higher the dosage was the more dreams he would had. Assured pt that he will be tried on a lower dose.  Allergies: Pt states that his allergies are doing better. Occasionally, he will have some chest and nasal congestion.  Colonoscopy: pt states that he is due for  the procedure, however, he is concerned about the negative effects of the procedure. Advised the pt to schedule and appointment with his GI specialist to discuss the details of the procedure or maybe discuss other possible procedures. Pt agrees and it willing to follow up with his GI doctor.  Family history: pt reports a hx of DM on his father's side. He states that his grandfather had DM, but died from liver cancer.   Vaccines: pt states that he is unsure of the date of his last tetanus vaccine, but he states that he will follow up with his previous PCP and get a record of his immunization. Pt was also thinking about updating his pneumonia vaccine, however, I advised pt that his next booster is not due until the age of 55.   Patient Active Problem List   Diagnosis Date Noted  . DDD (degenerative disc disease), cervical 02/27/2013  . HTN (hypertension) 02/27/2013  . Allergic rhinitis 02/27/2013   Past Medical History  Diagnosis Date  . Allergy   . Depression   . Asthma   . Hypertension   . Ureteral stone   . Dysuria-frequency syndrome   . History of nausea     related to stone  . Degenerative joint disease 2012   Past Surgical History  Procedure Laterality Date  . Prk Left   .  Cholecystectomy    . Cystoscopy/retrograde/ureteroscopy  10/08/2012    Procedure: CYSTOSCOPY/RETROGRADE/URETEROSCOPY;  Surgeon: [name], MD;  Location: [facility];  Service: Urology;  Laterality: Left;  stent   . Trigger finger release    . Cystoscopy with retrograde pyelogram, ureteroscopy and stent placement  10/27/2012    Procedure: [place], URETEROSCOPY AND STENT PLACEMENT;  Surgeon: [name], MD;  Location: [facility];  Service: Urology;  Laterality: Left;  . Holmium laser application  36/14/4315    Procedure: HOLMIUM LASER APPLICATION;  Surgeon: [name], MD;  Location: [facility];  Service: Urology;  Laterality: Left;  .  Cystoscopy w/ ureteral stent removal  10/27/2012    Procedure: CYSTOSCOPY WITH STENT REMOVAL;  Surgeon: [name], MD;  Location: [facility];  Service: Urology;  Laterality: Left;   No Known Allergies Prior to Admission medications   Medication Sig Start Date End Date Taking? Authorizing Provider  albuterol (PROVENTIL HFA;VENTOLIN HFA) 108 (90 BASE) MCG/ACT inhaler Inhale 2 puffs into the lungs every 4 (four) hours as needed. 01/26/12 01/25/13  Darlyne Russian, MD  amphetamine-dextroamphetamine (ADDERALL XR) 10 MG 24 hr capsule Take 10 mg by mouth every morning. Pt is on a  Staggered dosage.  Taking 5 tablets beginning on Monday, tapering down 1 tablet daily    Historical Provider, MD  buPROPion (WELLBUTRIN XL) 150 MG 24 hr tablet Take 450 mg by mouth daily.    Historical Provider, MD  diazepam (DIASTAT) 2.5 MG GEL Place 2.5 mg rectally once.    Historical Provider, MD  levocetirizine (XYZAL) 5 MG tablet Take 1 tablet (5 mg total) by mouth every evening. 01/22/14   Darlyne Russian, MD  losartan (COZAAR) 50 MG tablet Take 1 tablet (50 mg total) by mouth daily. 01/22/14   Darlyne Russian, MD  methocarbamol (ROBAXIN) 500 MG tablet Take 500 mg by mouth 4 (four) times daily.    Historical Provider, MD  montelukast (SINGULAIR) 10 MG tablet Take 1 tablet (10 mg total) by mouth at bedtime. 01/22/14 01/22/15  Darlyne Russian, MD  montelukast (SINGULAIR) 10 MG tablet TAKE 1 TABLET BY MOUTH AT BEDTIME    Darlyne Russian, MD   History   Social History  . Marital Status: Married    Spouse Name: N/A    Number of Children: N/A  . Years of Education: N/A   Occupational History  . system analyst [employer]   Social History Main Topics  . Smoking status: Never Smoker   . Smokeless tobacco: Never Used  . Alcohol Use: Yes     Comment: occasionally - 1-3 beers per week  . Drug Use: No  . Sexual Activity: Yes     Comment: number of sex partners in the last 57 months 1   Other Topics Concern   . Not on file   Social History Narrative  . No narrative on file    Review of Systems  Constitutional: Negative for fatigue and unexpected weight change.  Eyes: Negative for visual disturbance.  Respiratory: Negative for cough, chest tightness and shortness of breath.   Cardiovascular: Negative for chest pain, palpitations and leg swelling.  Gastrointestinal: Negative for abdominal pain and blood in stool.  Musculoskeletal: Positive for neck stiffness.  Allergic/Immunologic: Positive for environmental allergies.  Neurological: Positive for tremors. Negative for dizziness, light-headedness and headaches.   patient also having difficulty with erectile dysfunction and would like to try medication for this .  Objective:   Physical Exam  Nursing note and vitals reviewed. CONSTITUTIONAL: Well developed/well nourished HEAD: Normocephalic/atraumatic EYES: EOMI/PERRL ENMT: Mucous membranes moist. TM's normal bilaterally. Oropharynx is clear, no erythema. NECK: supple no meningeal signs SPINE:entire spine nontender CV: S1/S2 noted, no murmurs/rubs/gallops noted LUNGS: Lungs are clear to auscultation bilaterally, no apparent distress ABDOMEN: soft, nontender, no rebound or guarding GU:no cva tenderness. Rectal exam was differed pt states that he seen [name]. NEURO: Pt is awake/alert, moves all extremitiesx4 EXTREMITIES: pulses normal, full ROM SKIN: warm, color normal. 1.0  x 1.5 cm Seborrheic keratosis to left arm. PSYCH: no abnormalities of mood noted   Filed Vitals:   07/23/14 0809  BP: 128/90  Pulse: 84  Temp: 98.3 F (36.8 C)  TempSrc: Oral  Resp: 16  Height: 5' 8.25" (1.734 m)  Weight: 223 lb 3.2 oz (101.243 kg)  SpO2: 98%   .[facility] reading (PRIMARY) by  Dr.Shed Nixon no acute disease nor abnormalities  Assessment & Plan:   The lesion on his arm does not need to be treated. Will give trial of Neurontin to help with his finger numbness. He has vivid dreams with this  medication but he would like to try this medication again. Referral to GI for evaluation for colonoscopy. He will check on the status of his tetanus and pt declined a flu shot today. We'll give him a call of Viagra for ED   I personally performed the services described in this documentation, which was scribed in my presence. The recorded information has been reviewed and is accurate.

## 2014-07-25 ENCOUNTER — Encounter: Payer: Self-pay | Admitting: Internal Medicine

## 2014-07-31 IMAGING — CR DG ABDOMEN 1V
3 series · 3 of 3 positions shown · non-contrast
Comparison: No priors.

CLINICAL DATA: Abdominal pain.  History of left renal colic.

ABDOMEN - 1 VIEW

[AP (1 of 3)]
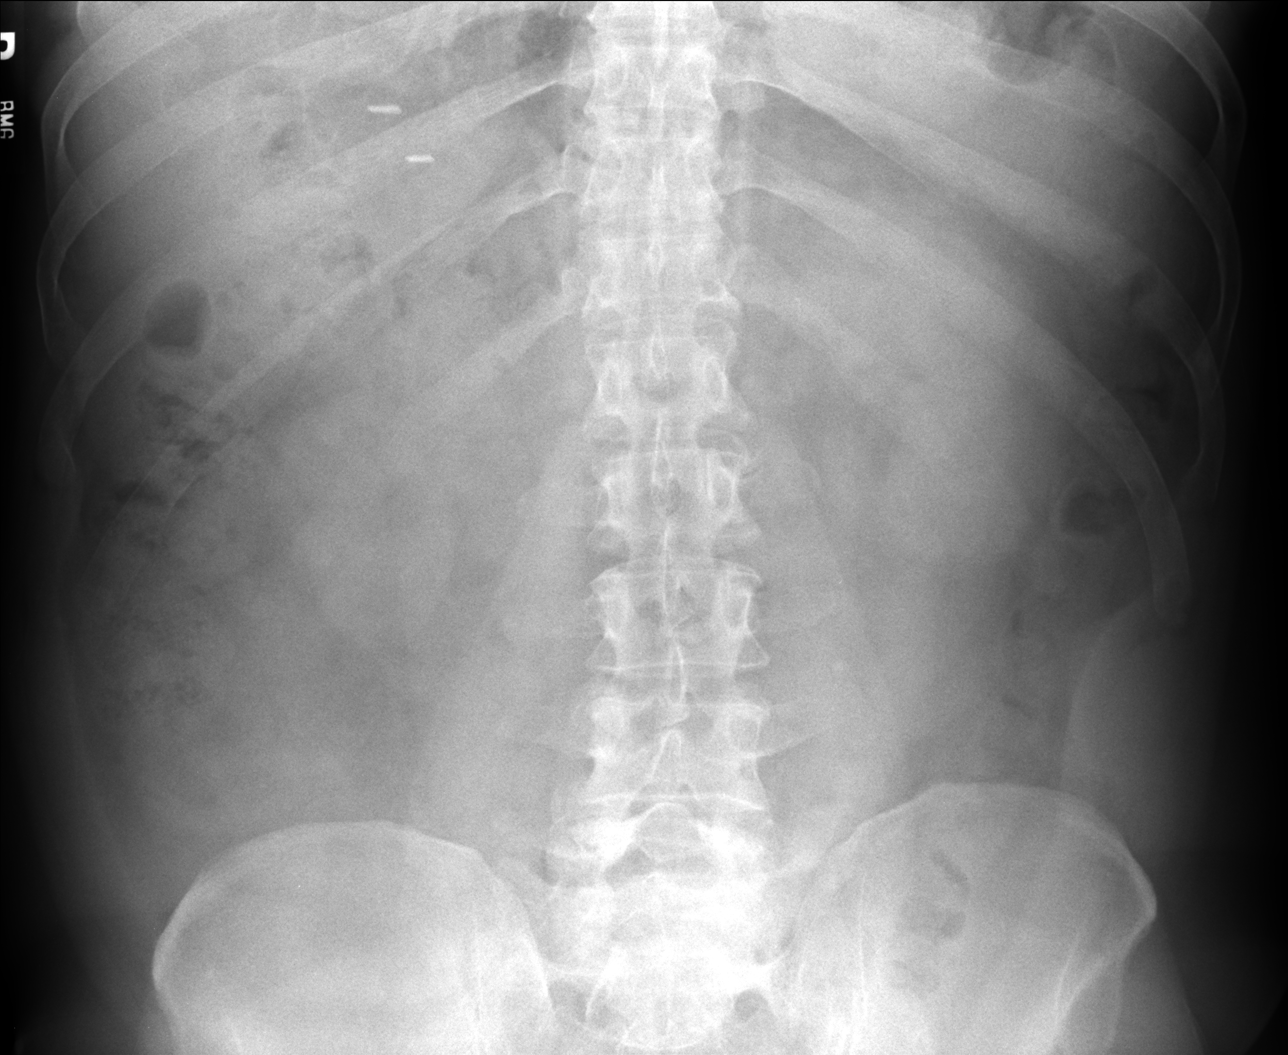

[AP (2 of 3)]
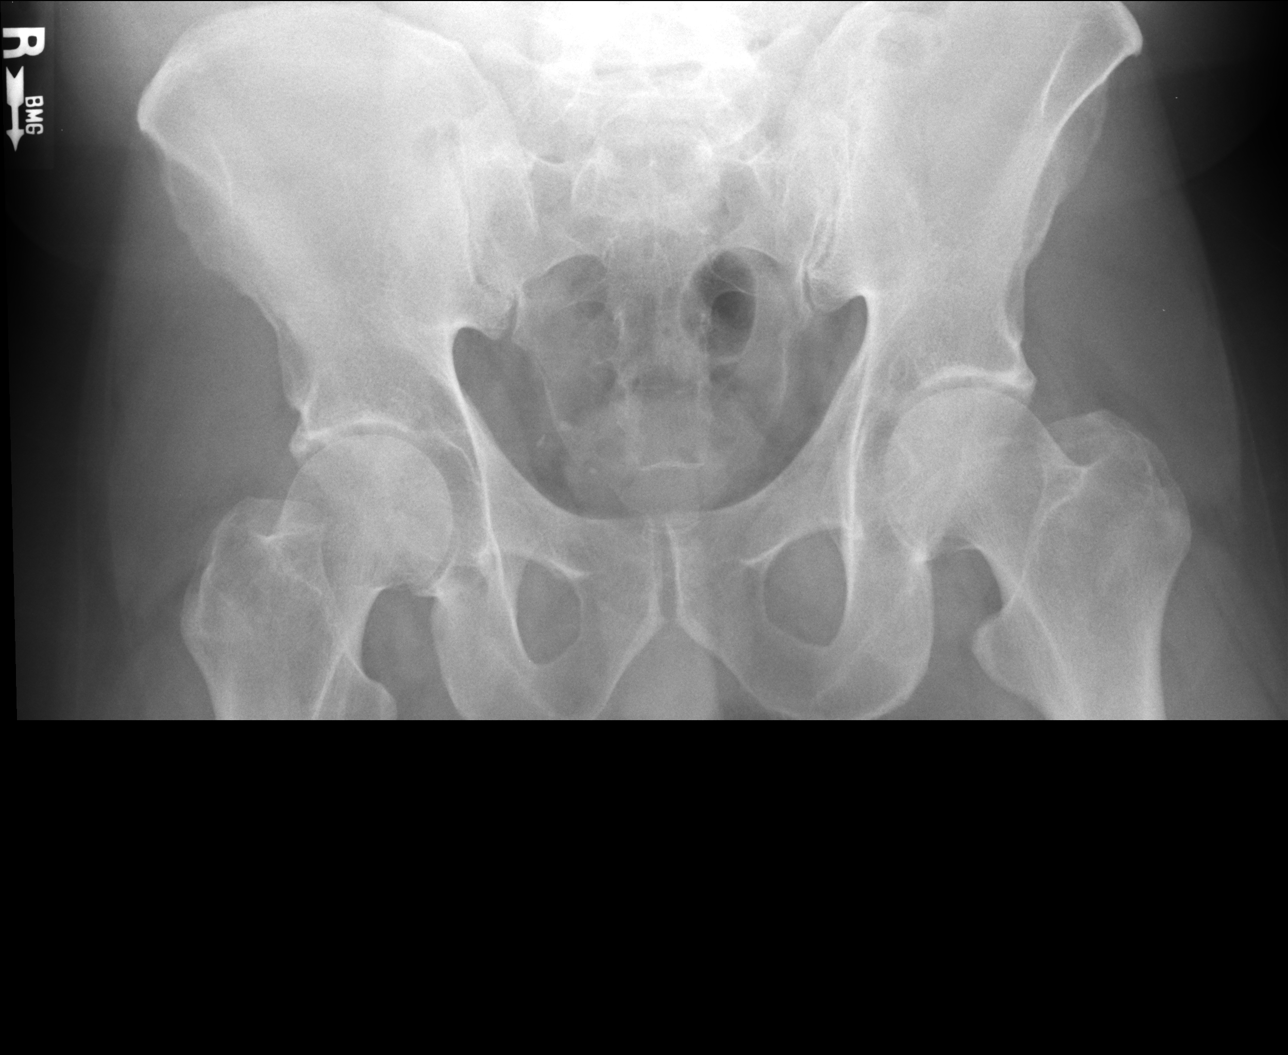

[AP (3 of 3)]
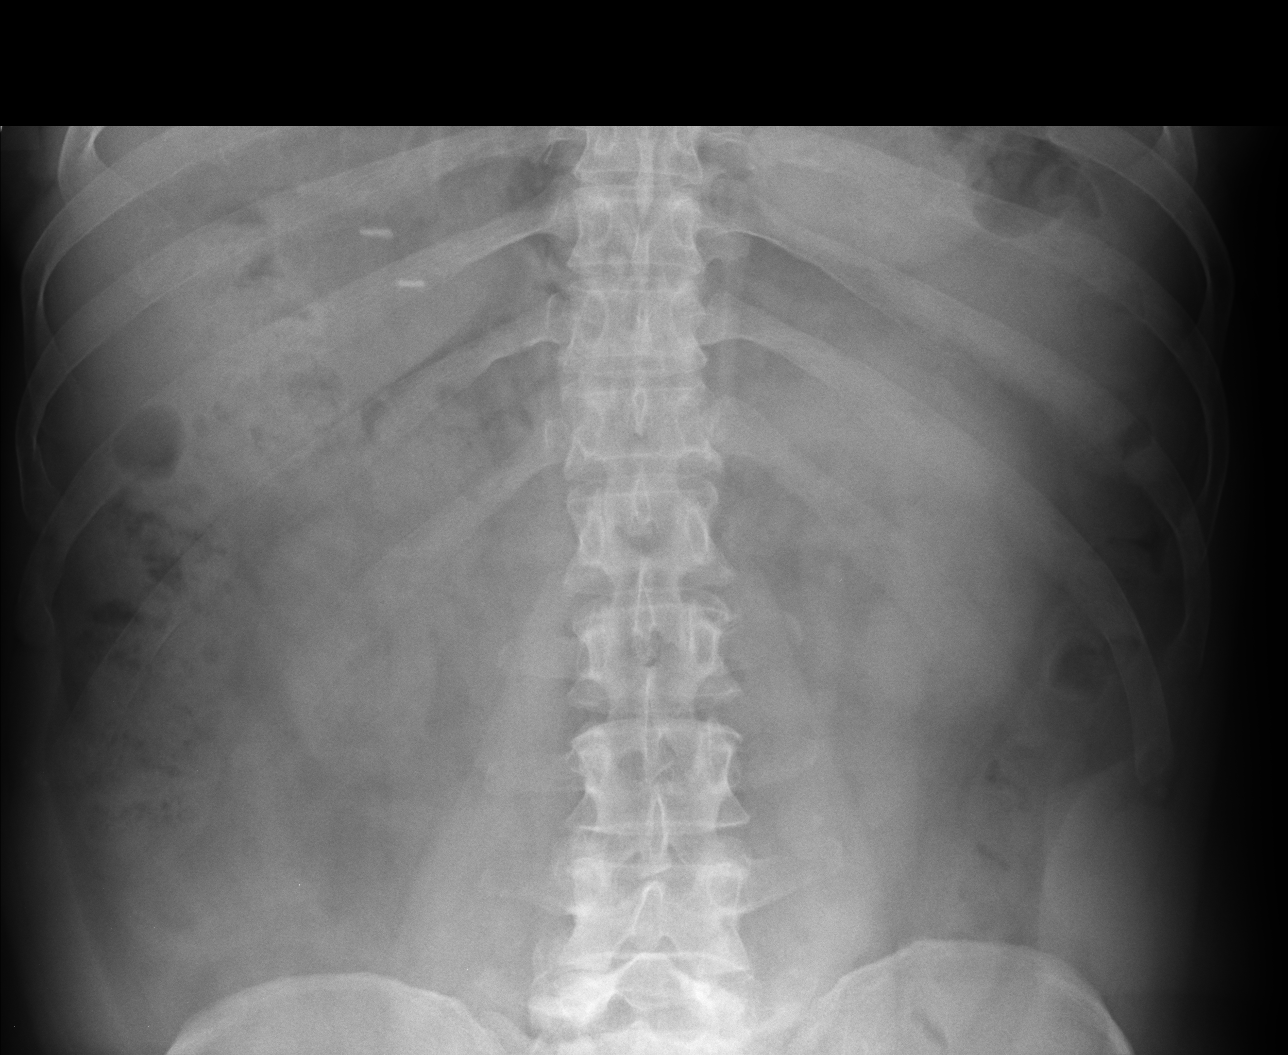

[3 of 3 positions shown; findings below may reference images not displayed]

FINDINGS: There is an ill-defined density to the left of the L3-L4
interspace, suspicious for a left ureteral stone.  No additional
calculi are otherwise noted.  Gas and stool is seen scattered
throughout the colon extending to the level of the distal rectum.
No pathologic distension of small bowel is noted.  Surgical clips
project over the right upper quadrant of the abdomen, likely from
prior cholecystectomy.
IMPRESSION: 1.  Ill-defined 5 mm opacity projecting to the left of the L3-L4
interspace, suspicious for possible left ureteral calculus.  This
could be confirmed with a noncontrast CT of the abdomen and pelvis
if clinically indicated.

## 2014-08-04 IMAGING — CR DG ABDOMEN 1V
2 series · 2 of 2 positions shown · non-contrast
Comparison: Abdominal radiographs 10/04/2012 were reviewed

CLINICAL DATA: Left flank pain. Known kidney stone.  Hematuria.

ABDOMEN - 1 VIEW

[t abdomen supine (1 of 2)]
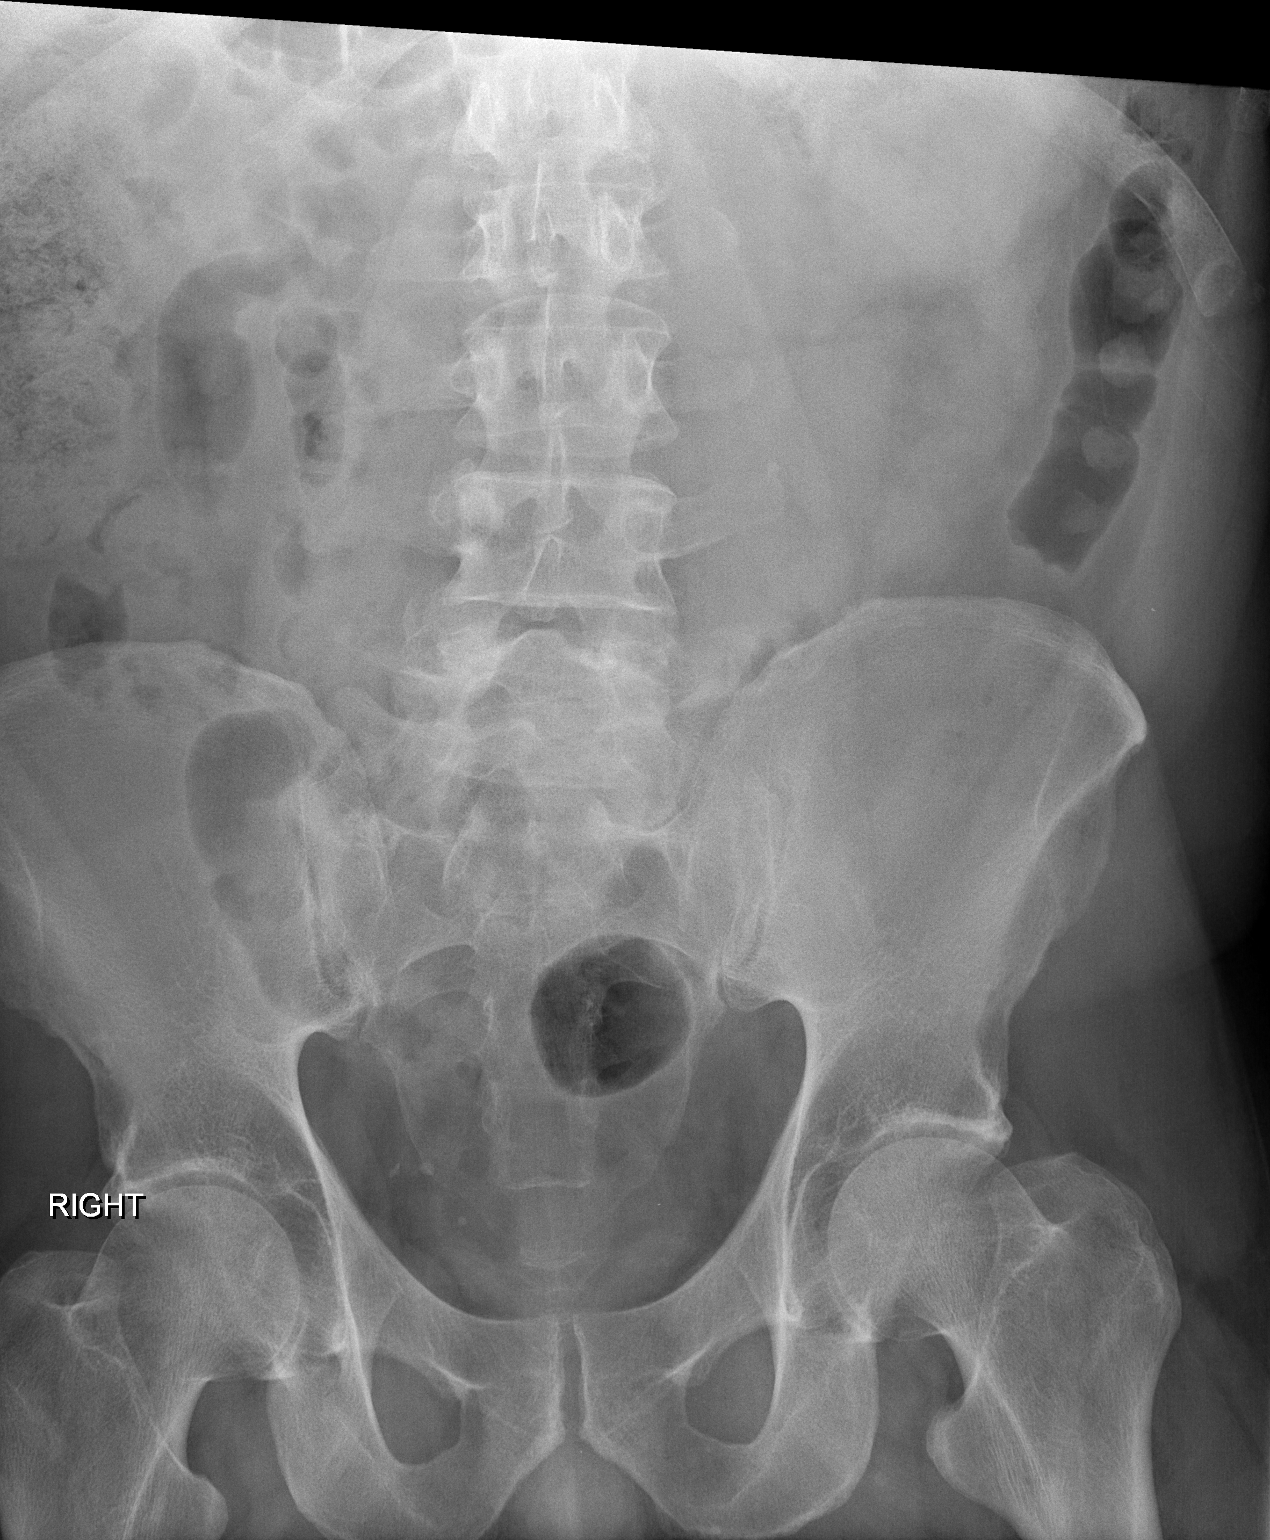

[t abdomen supine (2 of 2)]
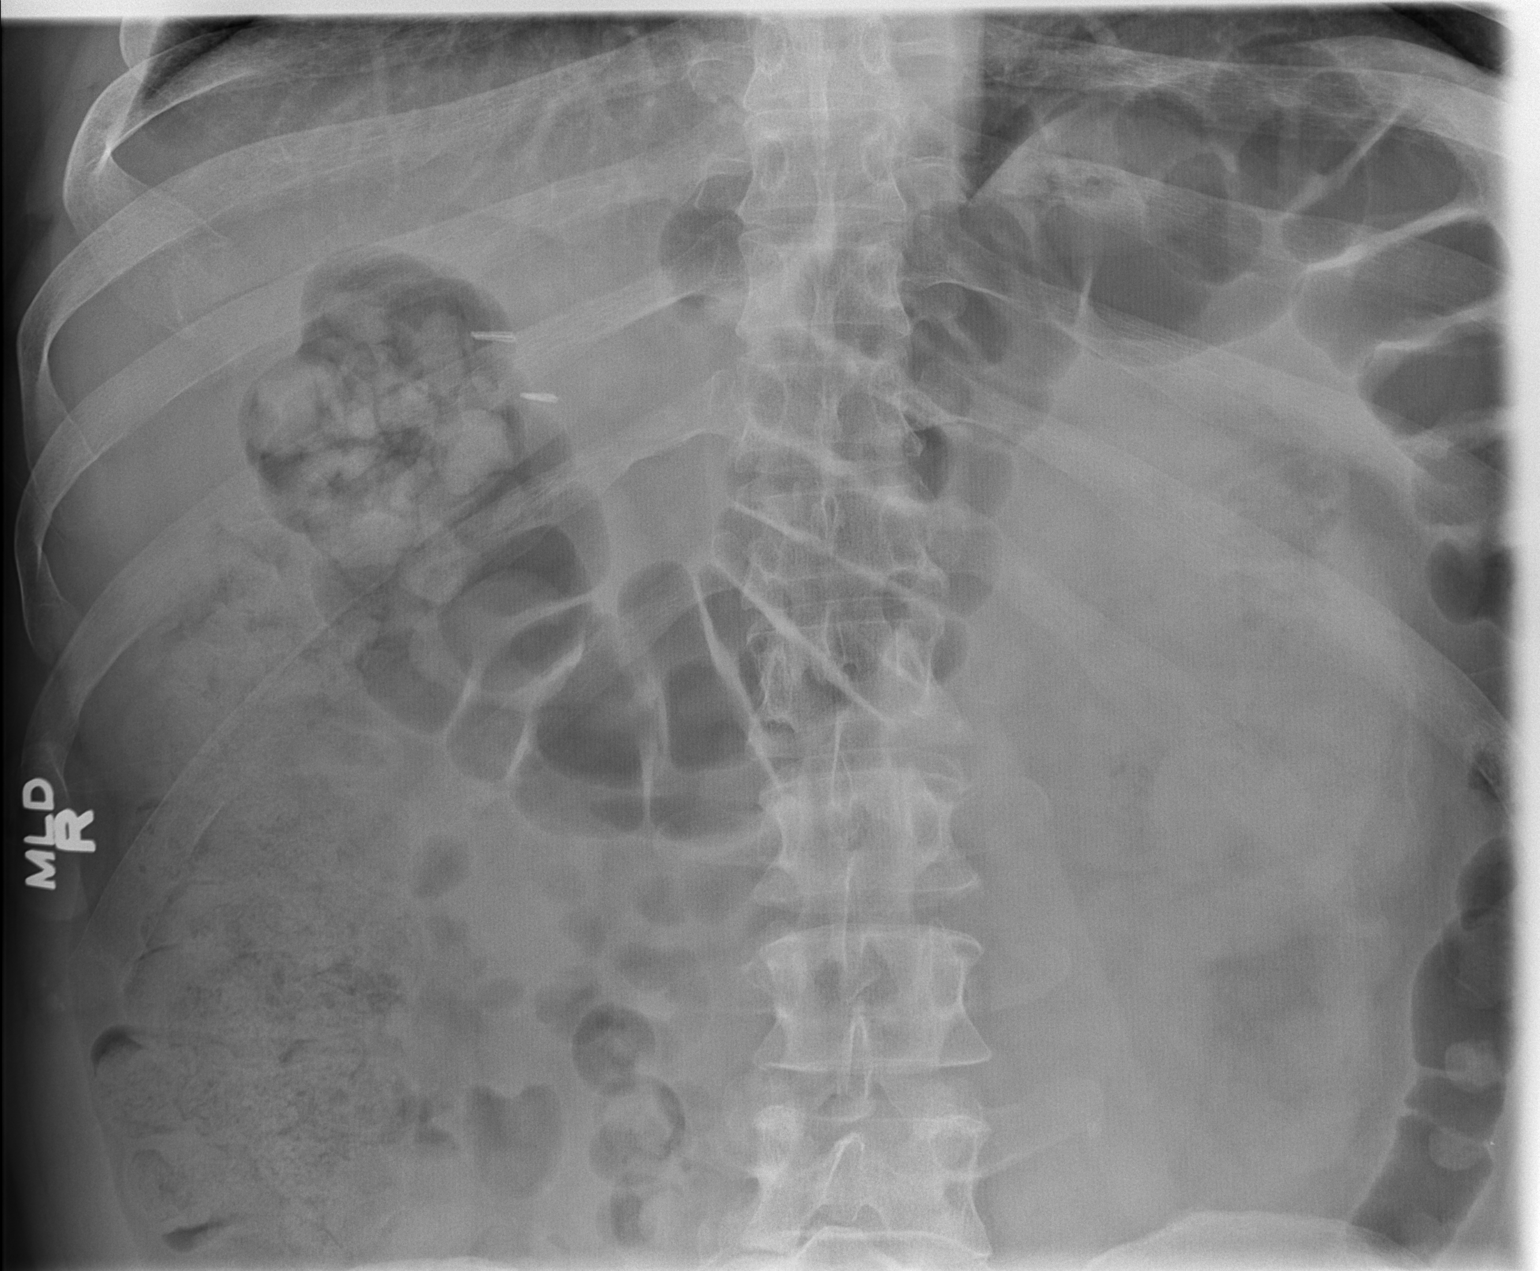

[2 of 2 positions shown; findings below may reference images not displayed]

FINDINGS: An approximately 4 mm calcific density is again seen in
the left abdomen, near the level of this left spinous process of
L4.  This is without significant change in appearance compared to
the abdominal radiograph of 10/04/2012.    This could be a left
ureteral stone.  No definite stone is seen in the expected location
of either renal shadow.  Bowel gas projects over the expected
locations of the kidneys, particular on the right.  Cholecystectomy
clips.
IMPRESSION: No significant change in approximately 4 mm calcification adjacent
to the left transverse process of L4.  A left ureteral stone cannot
be excluded.

## 2014-09-24 ENCOUNTER — Ambulatory Visit (INDEPENDENT_AMBULATORY_CARE_PROVIDER_SITE_OTHER): Payer: 59 | Admitting: Internal Medicine

## 2014-09-24 ENCOUNTER — Encounter: Payer: Self-pay | Admitting: Internal Medicine

## 2014-09-24 VITALS — BP 120/84 | HR 80 | Ht 68.0 in | Wt 224.8 lb

## 2014-09-24 DIAGNOSIS — Z1211 Encounter for screening for malignant neoplasm of colon: Secondary | ICD-10-CM

## 2014-09-24 NOTE — Patient Instructions (Signed)
Today you have been given information to read on colon cancer screenings.   I appreciate the opportunity to care for you.

## 2014-09-24 NOTE — Progress Notes (Signed)
  Referred by Dr. Nena Jordan Subjective:    Patient ID: Christopher Bolton, male    DOB: [birth year], 53 y.o.   MRN: [id number]  HPI Patient is a very nice middle-aged white man who is here to discuss colon cancer screening. He is concerned about the possible aftereffects of anesthesia and a screening colonoscopy. He asks questions about the different screening options today. He is concerned that if he gets anesthesia he will of a sore throat etc. And I explained that IV sedation was used for colonoscopy to that should not be an issue. He is not having any active GI symptoms. He does say his father had colon polyps. Medications, allergies, past medical history, past surgical history, family history and social history are reviewed and updated in the EMR.   Review of Systems Positive for seasonal allergies, cough with allergies and some depressive symptoms.    Objective:   Physical Exam  General:  NAD Eyes:   anicteric Lungs:  clear Heart:  S1S2 no rubs, murmurs or gallops Abdomen:  soft and nontender, BS+ Ext:   no edema    Data Reviewed:  Labs, primary care notes in the EMR    Assessment & Plan:   1. Colon cancer screening    I spent 20 minutes reviewing the various options of colon cancer screening including the risks benefits and indications. We covered optical colonoscopy, CT colonoscopy, Cologuard stool DNA test and immune fecal occult blood testing. I've provided handouts to the patient as well. He will consider his options and contact either me or Dr. Elenore Rota to schedule.  I told him it is not so important what he chooses though optical colonoscopy is thought to be the gold standard to have one every 10 years of no polyps, but that it is important to choose something.I explained that if he chooses the noninvasive methods that are involved with stool testing if these tests are positive and an optical colonoscopy would be the next step.

## 2014-11-08 DIAGNOSIS — Z8719 Personal history of other diseases of the digestive system: Secondary | ICD-10-CM

## 2014-11-08 HISTORY — DX: Personal history of other diseases of the digestive system: Z87.19

## 2015-01-23 ENCOUNTER — Ambulatory Visit (INDEPENDENT_AMBULATORY_CARE_PROVIDER_SITE_OTHER): Payer: 59 | Admitting: Emergency Medicine

## 2015-01-23 ENCOUNTER — Encounter: Payer: Self-pay | Admitting: Emergency Medicine

## 2015-01-23 VITALS — BP 122/80 | HR 67 | Temp 97.9°F | Resp 16 | Ht 68.5 in | Wt 223.0 lb

## 2015-01-23 DIAGNOSIS — Z91048 Other nonmedicinal substance allergy status: Secondary | ICD-10-CM

## 2015-01-23 DIAGNOSIS — Z9109 Other allergy status, other than to drugs and biological substances: Secondary | ICD-10-CM

## 2015-01-23 DIAGNOSIS — F329 Major depressive disorder, single episode, unspecified: Secondary | ICD-10-CM | POA: Diagnosis not present

## 2015-01-23 DIAGNOSIS — I10 Essential (primary) hypertension: Secondary | ICD-10-CM

## 2015-01-23 DIAGNOSIS — N528 Other male erectile dysfunction: Secondary | ICD-10-CM

## 2015-01-23 DIAGNOSIS — F32A Depression, unspecified: Secondary | ICD-10-CM

## 2015-01-23 LAB — HEPATITIS C ANTIBODY: HCV AB: NEGATIVE

## 2015-01-23 MED ORDER — METHYLPREDNISOLONE ACETATE 40 MG/ML IJ SUSP
40.0000 mg | Freq: Once | INTRAMUSCULAR | Status: AC
  Administered 2015-01-23: 40 mg via INTRAMUSCULAR

## 2015-01-23 MED ORDER — SILDENAFIL CITRATE 20 MG PO TABS: ORAL_TABLET | ORAL | Status: DC

## 2015-01-23 MED ORDER — LEVOCETIRIZINE DIHYDROCHLORIDE 5 MG PO TABS: 5.0000 mg | ORAL_TABLET | Freq: Every evening | ORAL | Status: DC

## 2015-01-23 MED ORDER — CYCLOBENZAPRINE HCL 5 MG PO TABS: ORAL_TABLET | ORAL | Status: DC

## 2015-01-23 MED ORDER — LOSARTAN POTASSIUM 50 MG PO TABS: 50.0000 mg | ORAL_TABLET | Freq: Every day | ORAL | Status: DC

## 2015-01-23 MED ORDER — MONTELUKAST SODIUM 10 MG PO TABS: 10.0000 mg | ORAL_TABLET | Freq: Every day | ORAL | Status: DC

## 2015-01-23 NOTE — Progress Notes (Signed)
Subjective:  This chart was scribed for Darlyne Russian, MD by Tamsen Roers, at [facility].  This patient was seen in room 21 and the patient's care was started at 8:12 AM.    Patient ID: Christopher Bolton, male    DOB: Feb 24, 1961, 54 y.o.   MRN: [id number]  HPI HPI Comments: Christopher Bolton is a 54 y.o. male who presents to [facility] for a follow up.  Patient notes he has had a tough year after his friend of 40 years passed away from cancer and feels lost now that he doesn't have someone to talk to everyday.  He has the name of a therapist that he will go see to.  Patient is due to see Dr. Robina Ade in 6-8 weeks. Patient states that he does not exercise much besides walking his dogs.  Patient needs refills for his medications today.   Cough:  Patient also complains of an intermittent cough that he thinks may be related to his allergies.  He has not tried using Flonase because he says he does not react well to it.     Muscle Aches: Patient states he has difficulty sleeping due to shoulder/arm/hand pain which feels like they are on fire/achey.  He has been on muscle relaxers for 2-3 months after his surgery but states that he does not have full range of motion turning to the right side. Patient notes that he has stretched to alleviate his symptoms but denies any relief.  He is willing to try going to a physical therapist if trying muscle relaxers dont work this time around.   BP Left arm:  134/96    Patient Active Problem List   Diagnosis Date Noted  . DDD (degenerative disc disease), cervical 02/27/2013  . HTN (hypertension) 02/27/2013  . Allergic rhinitis 02/27/2013   Past Medical History  Diagnosis Date  . Allergy   . Depression   . Asthma   . Hypertension   . Ureteral stone   . Dysuria-frequency syndrome   . History of nausea     related to stone  . Degenerative joint disease 2012  . Attention deficit disorder    Past Surgical History    Procedure Laterality Date  . Prk Left   . Cholecystectomy    . Cystoscopy/retrograde/ureteroscopy  10/08/2012    Procedure: CYSTOSCOPY/RETROGRADE/URETEROSCOPY;  Surgeon: [name], MD;  Location: [facility];  Service: Urology;  Laterality: Left;  stent   . Trigger finger release    . Cystoscopy with retrograde pyelogram, ureteroscopy and stent placement  10/27/2012    Procedure: [place], URETEROSCOPY AND STENT PLACEMENT;  Surgeon: [name], MD;  Location: [facility];  Service: Urology;  Laterality: Left;  . Holmium laser application  40/98/1191    Procedure: HOLMIUM LASER APPLICATION;  Surgeon: [name], MD;  Location: [facility];  Service: Urology;  Laterality: Left;  . Cystoscopy w/ ureteral stent removal  10/27/2012    Procedure: CYSTOSCOPY WITH STENT REMOVAL;  Surgeon: [name], MD;  Location: [facility];  Service: Urology;  Laterality: Left;  . Cervical fusion  2014   No Known Allergies Prior to Admission medications   Medication Sig Start Date End Date Taking? Authorizing Provider  albuterol (PROVENTIL HFA;VENTOLIN HFA) 108 (90 BASE) MCG/ACT inhaler Inhale 2 puffs into the lungs every 4 (four) hours as needed. 01/26/12 01/25/13  Darlyne Russian, MD  amphetamine-dextroamphetamine (ADDERALL XR) 10 MG 24 hr  capsule Take 10 mg by mouth every morning. Pt is on a  Staggered dosage.  Taking 5 tablets beginning on Monday, tapering down 1 tablet daily    Historical Provider, MD  buPROPion (WELLBUTRIN XL) 150 MG 24 hr tablet Take 450 mg by mouth daily.    Historical Provider, MD  gabapentin (NEURONTIN) 300 MG capsule Start one tablet at bedtime for 3-4 days and if tolerated increase to 1 tablet in the morning and one tablet at bedtime. Over the next 2 weeks you  can increase to one tablet in the morning and 2 tablets at bedtime. 07/23/14   Darlyne Russian, MD  levocetirizine (XYZAL) 5 MG tablet Take 1 tablet  (5 mg total) by mouth every evening. 01/22/14   Darlyne Russian, MD  losartan (COZAAR) 50 MG tablet Take 1 tablet (50 mg total) by mouth daily. 07/23/14   Darlyne Russian, MD  methocarbamol (ROBAXIN) 500 MG tablet Take 500 mg by mouth 4 (four) times daily.    Historical Provider, MD  montelukast (SINGULAIR) 10 MG tablet Take 1 tablet (10 mg total) by mouth at bedtime. 01/22/14 01/22/15  Darlyne Russian, MD  sildenafil (VIAGRA) 100 MG tablet Take 0.5-1 tablets (50-100 mg total) by mouth daily as needed for erectile dysfunction. 07/23/14   Darlyne Russian, MD   History   Social History  . Marital Status: Married    Spouse Name: N/A  . Number of Children: N/A  . Years of Education: N/A   Occupational History  . system analyst [employer]   Social History Main Topics  . Smoking status: Never Smoker   . Smokeless tobacco: Never Used  . Alcohol Use: No     Comment: occasionally - 1-3 beers per week  . Drug Use: No  . Sexual Activity: Yes     Comment: number of sex partners in the last 51 months 1   Other Topics Concern  . 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  HEAD: Normocephalic/atraumatic EYES: EOMI/PERRL ENMT: Mucous membranes moist NECK: Tender on the right side of his neck with limited ROM with turning to the right.  SPINE/BACK:entire spine nontender CV: S1/S2 noted, no murmurs/rubs/gallops noted LUNGS: Lungs are clear to auscultation bilaterally, no apparent distress ABDOMEN: soft, nontender, no rebound or guarding, bowel sounds noted throughout abdomen GU:no cva tenderness NEURO: Pt is awake/alert/appropriate, moves all extremitiesx4.  No facial droop.   EXTREMITIES: pulses normal/equal, full ROM SKIN: warm, color  normal PSYCH: no abnormalities of mood noted, alert and oriented to situation     Filed Vitals:   01/23/15 0808  BP: 122/80  Pulse: 67  Temp: 97.9 F (36.6 C)  Resp: 16  Height: 5' 8.5" (1.74 m)  Weight: 223 lb (101.152 kg)  SpO2: 95%          Assessment & Plan:  Meds were refilled. I switched him to sildenafil 20 mg generic instead of Viagra. He was given 40 of Depo-Medrol to help with allergy season. Hep C test was done because this will reassure him regarding the recent lost of his best friend hep C and liver cancer. Other medications were refilled. Recheck 6 months for his physical. Will try some Flexeril  for his muscle tightness in the neck. He will discuss with his psychiatrist the change in medications for depression. He will also look into therapy to help with his grieving situation..I personally performed the services described in this documentation, which was scribed in my presence. The recorded information has been reviewed and is accurate.

## 2015-01-28 ENCOUNTER — Encounter: Payer: Self-pay | Admitting: Family Medicine

## 2015-04-21 ENCOUNTER — Ambulatory Visit (HOSPITAL_COMMUNITY)
Admission: RE | Admit: 2015-04-21 | Discharge: 2015-04-21 | Disposition: A | Discharge status: Home / Self Care | Payer: 59 | Source: Ambulatory Visit | Attending: Emergency Medicine | Admitting: Emergency Medicine

## 2015-04-21 ENCOUNTER — Ambulatory Visit (INDEPENDENT_AMBULATORY_CARE_PROVIDER_SITE_OTHER): Payer: 59 | Admitting: Emergency Medicine

## 2015-04-21 ENCOUNTER — Encounter (HOSPITAL_COMMUNITY): Payer: Self-pay

## 2015-04-21 ENCOUNTER — Ambulatory Visit (INDEPENDENT_AMBULATORY_CARE_PROVIDER_SITE_OTHER): Payer: 59

## 2015-04-21 VITALS — BP 156/100 | HR 84 | Temp 99.0°F | Resp 17 | Ht 68.5 in | Wt 225.0 lb

## 2015-04-21 DIAGNOSIS — R300 Dysuria: Secondary | ICD-10-CM

## 2015-04-21 DIAGNOSIS — K5732 Diverticulitis of large intestine without perforation or abscess without bleeding: Secondary | ICD-10-CM

## 2015-04-21 DIAGNOSIS — M4806 Spinal stenosis, lumbar region: Secondary | ICD-10-CM | POA: Insufficient documentation

## 2015-04-21 DIAGNOSIS — K760 Fatty (change of) liver, not elsewhere classified: Secondary | ICD-10-CM | POA: Diagnosis not present

## 2015-04-21 DIAGNOSIS — R1032 Left lower quadrant pain: Secondary | ICD-10-CM

## 2015-04-21 DIAGNOSIS — N200 Calculus of kidney: Secondary | ICD-10-CM | POA: Diagnosis not present

## 2015-04-21 DIAGNOSIS — R110 Nausea: Secondary | ICD-10-CM | POA: Diagnosis present

## 2015-04-21 LAB — POCT URINALYSIS DIPSTICK
BILIRUBIN UA: NEGATIVE
GLUCOSE UA: NEGATIVE
Ketones, UA: NEGATIVE
Leukocytes, UA: NEGATIVE
NITRITE UA: NEGATIVE
Protein, UA: NEGATIVE
RBC UA: NEGATIVE
SPEC GRAV UA: 1.025
Urobilinogen, UA: 0.2
pH, UA: 5.5

## 2015-04-21 LAB — POCT CBC
GRANULOCYTE PERCENT: 60.8 % (ref 37–80)
HCT, POC: 48.41 % (ref 43.5–53.7)
Hemoglobin: 16.4 g/dL (ref 14.1–18.1)
Lymph, poc: 2 (ref 0.6–3.4)
MCH, POC: 29.4 pg (ref 27–31.2)
MCHC: 34.2 g/dL (ref 31.8–35.4)
MCV: 85.8 fL (ref 80–97)
MID (cbc): 0.5 (ref 0–0.9)
MPV: 7.7 fL (ref 0–99.8)
POC Granulocyte: 3.8 (ref 2–6.9)
POC LYMPH PERCENT: 31 %L (ref 10–50)
POC MID %: 8.5 %M (ref 0–12)
Platelet Count, POC: 290 10*3/uL (ref 142–424)
RBC: 5.6 M/uL (ref 4.69–6.13)
RDW, POC: 13 %
WBC: 6.3 10*3/uL (ref 4.6–10.2)

## 2015-04-21 LAB — POCT UA - MICROSCOPIC ONLY
BACTERIA, U MICROSCOPIC: NEGATIVE
Casts, Ur, LPF, POC: NEGATIVE
Crystals, Ur, HPF, POC: NEGATIVE
Mucus, UA: NEGATIVE
RBC, URINE, MICROSCOPIC: NEGATIVE
Yeast, UA: NEGATIVE

## 2015-04-21 LAB — HEMOCCULT GUIAC POC 1CARD (OFFICE): Fecal Occult Blood, POC: NEGATIVE

## 2015-04-21 MED ORDER — CIPROFLOXACIN HCL 500 MG PO TABS: 500.0000 mg | ORAL_TABLET | Freq: Two times a day (BID) | ORAL | Status: DC

## 2015-04-21 MED ORDER — METRONIDAZOLE 500 MG PO TABS: 500.0000 mg | ORAL_TABLET | Freq: Three times a day (TID) | ORAL | Status: DC

## 2015-04-21 MED ORDER — IOHEXOL 300 MG/ML  SOLN
100.0000 mL | Freq: Once | INTRAMUSCULAR | Status: AC | PRN
  Administered 2015-04-21: 100 mL via INTRAVENOUS

## 2015-04-21 MED ORDER — IOHEXOL 300 MG/ML  SOLN
50.0000 mL | Freq: Once | INTRAMUSCULAR | Status: AC | PRN
  Administered 2015-04-21: 50 mL via ORAL

## 2015-04-21 NOTE — Progress Notes (Addendum)
Subjective:  This chart was scribed for Nena Jordan, MD by Thea Alken, ED Scribe. This patient was seen in room 11 and the patient's care was started at 9:43 AM.  Patient ID: Christopher Bolton, male    DOB: April 13, 1961, 54 y.o.   MRN: [id number]  HPI Chief Complaint  Patient presents with  . Dysuria    urine frequency,   . Abdominal Pain   HPI Comments: Christopher Bolton is a 54 y.o. male who presents to the [facility] complaining of low abdominal pain that began 3 days. Pt reports symptoms began with abdominal cramping and discomfort 3 days ago with associated urinary urgency, frequency, and right low back soreness. Pt states he wakes up several time during the night to urinate and that his BM has slightly decreased in size. Pt has hx of kidney stones but denies pain being similar. Pt has been treated for prostate infection in the past.  Pt has not had colonoscopy.   Past Medical History  Diagnosis Date  . Allergy   . Depression   . Asthma   . Hypertension   . Ureteral stone   . Dysuria-frequency syndrome   . History of nausea     related to stone  . Degenerative joint disease 2012  . Attention deficit disorder    No Known Allergies Prior to Admission medications   Medication Sig Start Date End Date Taking? Authorizing Provider  albuterol (PROVENTIL HFA;VENTOLIN HFA) 108 (90 BASE) MCG/ACT inhaler Inhale 2 puffs into the lungs every 4 (four) hours as needed. 01/26/12 04/21/15 Yes Darlyne Russian, MD  amphetamine-dextroamphetamine (ADDERALL XR) 10 MG 24 hr capsule Take 10 mg by mouth every morning. Pt is on a  Staggered dosage.  Taking 5 tablets beginning on Monday, tapering down 1 tablet daily   Yes Historical Provider, MD  buPROPion (WELLBUTRIN XL) 150 MG 24 hr tablet Take 450 mg by mouth daily.   Yes Historical Provider, MD  levocetirizine (XYZAL) 5 MG tablet Take 1 tablet (5 mg total) by mouth every evening. 01/23/15  Yes Darlyne Russian, MD  losartan (COZAAR) 50 MG  tablet Take 1 tablet (50 mg total) by mouth daily. 01/23/15  Yes Darlyne Russian, MD  sildenafil (REVATIO) 20 MG tablet Take 2-3 tablets one hour prior to intercourse. 01/23/15  Yes Darlyne Russian, MD  gabapentin (NEURONTIN) 300 MG capsule Start one tablet at bedtime for 3-4 days and if tolerated increase to 1 tablet in the morning and one tablet at bedtime. Over the next 2 weeks you  can increase to one tablet in the morning and 2 tablets at bedtime. Patient not taking: Reported on 04/21/2015 07/23/14   Darlyne Russian, MD   Review of Systems  Constitutional: Negative for fever and chills.  Gastrointestinal: Positive for abdominal pain.  Genitourinary: Positive for urgency and frequency.  Musculoskeletal: Positive for back pain.   Objective:   Physical Exam  CONSTITUTIONAL: Well developed/well nourished HEAD: Normocephalic/atraumatic EYES: EOMI/PERRL ENMT: Mucous membranes moist NECK: supple no meningeal signs SPINE/BACK:entire spine nontender CV: S1/S2 noted, no murmurs/rubs/gallops noted LUNGS: Lungs are clear to auscultation bilaterally, no apparent distress ABDOMEN: soft, nontender, no rebound or guarding, bowel sounds noted throughout abdomen GU:no cva tenderness testicles are normal no hernia palpable prostate normal size slight tenderness on the left. NEURO: Pt is awake/alert/appropriate, moves all extremitiesx4.  No facial droop.   EXTREMITIES: pulses normal/equal, full ROM SKIN: warm, color normal PSYCH: no abnormalities of mood noted, alert  and oriented to situation  Filed Vitals:   04/21/15 0826  BP: 156/100  Pulse: 84  Temp: 99 F (37.2 C)  TempSrc: Oral  Resp: 17  Height: 5' 8.5" (1.74 m)  Weight: 225 lb (102.059 kg)  SpO2: 98%    Results for orders placed or performed in visit on 04/21/15  POCT CBC  Result Value Ref Range   WBC 6.3 4.6 - 10.2 K/uL   Lymph, poc 2.0 0.6 - 3.4   POC LYMPH PERCENT 31.0 10 - 50 %L   MID (cbc) 0.5 0 - 0.9   POC MID % 8.5 0 - 12 %M   POC  Granulocyte 3.8 2 - 6.9   Granulocyte percent 60.8 37 - 80 %G   RBC 5.60 4.69 - 6.13 M/uL   Hemoglobin 16.4 14.1 - 18.1 g/dL   HCT, POC 48.41 43.5 - 53.7 %   MCV 85.8 80 - 97 fL   MCH, POC 29.4 27 - 31.2 pg   MCHC 34.2 31.8 - 35.4 g/dL   RDW, POC 13.0 %   Platelet Count, POC 290 142 - 424 K/uL   MPV 7.7 0 - 99.8 fL  POCT UA - Microscopic Only  Result Value Ref Range   WBC, Ur, HPF, POC 0-2    RBC, urine, microscopic neg    Bacteria, U Microscopic neg    Mucus, UA neg    Epithelial cells, urine per micros 0-1    Crystals, Ur, HPF, POC neg    Casts, Ur, LPF, POC neg    Yeast, UA neg   POCT urinalysis dipstick  Result Value Ref Range   Color, UA yellow    Clarity, UA clear    Glucose, UA neg    Bilirubin, UA neg    Ketones, UA neg    Spec Grav, UA 1.025    Blood, UA neg    pH, UA 5.5    Protein, UA neg    Urobilinogen, UA 0.2    Nitrite, UA neg    Leukocytes, UA Negative   [facility] reading (PRIMARY) by  [name] there are clips present in the right upper abdomen no acute changes are seen  Assessment & Plan:  Patient has not had screening colonoscopy yet for financial reasons. He is having left lower abdominal pain. He does have a history kidney stones. CT abdomen pelvis will be performed this afternoon.I personally performed the services described in this documentation, which was scribed in my presence. The recorded information has been reviewed and is accurate. CT did show diverticulitis. I called in Cipro and Flagyl. Advised patient to recheck on Wednesday. Message left on the patients answering machine.  Nena Jordan, MD

## 2015-04-21 NOTE — Patient Instructions (Signed)
Go to [facility] main entrance and register at radiology for Outpatient CT scan. Do not eat or drink anything until after your scan.

## 2015-04-21 NOTE — Addendum Note (Signed)
Addended by: [name] A on: 04/21/2015 03:21 PM   Modules accepted: Orders

## 2015-04-22 LAB — URINE CULTURE
COLONY COUNT: NO GROWTH
Organism ID, Bacteria: NO GROWTH

## 2015-04-22 LAB — PSA: PSA: 2.51 ng/mL (ref <=4.00)

## 2015-04-23 ENCOUNTER — Ambulatory Visit (INDEPENDENT_AMBULATORY_CARE_PROVIDER_SITE_OTHER): Payer: 59 | Admitting: Emergency Medicine

## 2015-04-23 VITALS — BP 156/97 | HR 81 | Temp 98.1°F | Resp 16 | Ht 68.5 in | Wt 224.0 lb

## 2015-04-23 DIAGNOSIS — K5732 Diverticulitis of large intestine without perforation or abscess without bleeding: Secondary | ICD-10-CM

## 2015-04-23 NOTE — Patient Instructions (Signed)

## 2015-04-23 NOTE — Progress Notes (Addendum)
   Subjective:  This chart was scribed for [name], MD by Moises Blood, Medical Scribe. This patient was seen in Room 5 and the patient's care was started 9:30 AM.    Patient ID: Christopher Bolton, male    DOB: Jan 02, 1961, 54 y.o.   MRN: [id number]  HPI Christopher Bolton is a 54 y.o. male who presents to [facility] for follow up on abdominal pain that started 5 days ago. He states that he feels better than his last visit. He notes feeling a little nauseous due to the antibiotics. He denies fever or chills. He has had some bowel movements but still not as much as he wants. Since his last visit, he hasn't eaten as much and has been consciously trying to drink more liquids. He has family history of diverticulitis.     Review of Systems  Constitutional: Negative for fever and chills.       Objective:   Physical Exam CONSTITUTIONAL: Well developed/Wel nourished HEAD: Normocephalic/atraumatic EYES: EOMI/PERRL ENMT: Mucous membranes moist NECK: supple no meningeal signs SPINE/BACK: entire spine nontender CV: S1/S2 noted, no murmurs/rubs/gallops noted LUNGS: Lungs are clear to auscultation bilaterally, no apparent distress ABDOMEN: There is minimal tenderness in the left lower quadrant., no rebound or guarding, bowel sounds noted throughout abdomen GU: no cva tenderness NEURO: Pt is awake/alert/appropriate, moves all extremities x4. No facial droop. EXTREMITIES: pulses normal/equal, full ROM SKIN: warm, color normal PSYCH: no abnormalities of mood noted, alert, and oriented to situation        Assessment & Plan:   CT scan done was consistent with acute diverticulitis of the proximal sigmoid. Patient currently on Cipro and Flagyl. He is to follow-up next Monday at 8:00.I personally performed the services described in this documentation, which was scribed in my presence. The recorded information has been reviewed and is accurate.  Nena Jordan, MD

## 2015-04-30 ENCOUNTER — Ambulatory Visit (INDEPENDENT_AMBULATORY_CARE_PROVIDER_SITE_OTHER): Payer: 59 | Admitting: Emergency Medicine

## 2015-04-30 VITALS — BP 120/76 | HR 101 | Temp 98.9°F | Resp 16 | Ht 69.0 in | Wt 223.0 lb

## 2015-04-30 DIAGNOSIS — K5732 Diverticulitis of large intestine without perforation or abscess without bleeding: Secondary | ICD-10-CM

## 2015-04-30 DIAGNOSIS — R1032 Left lower quadrant pain: Secondary | ICD-10-CM | POA: Diagnosis not present

## 2015-04-30 DIAGNOSIS — R21 Rash and other nonspecific skin eruption: Secondary | ICD-10-CM | POA: Diagnosis not present

## 2015-04-30 LAB — POCT CBC
GRANULOCYTE PERCENT: 52.8 % (ref 37–80)
HCT, POC: 47.9 % (ref 43.5–53.7)
HEMOGLOBIN: 15.5 g/dL (ref 14.1–18.1)
Lymph, poc: 2.1 (ref 0.6–3.4)
MCH, POC: 28.7 pg (ref 27–31.2)
MCHC: 32.3 g/dL (ref 31.8–35.4)
MCV: 88.6 fL (ref 80–97)
MID (CBC): 0.7 (ref 0–0.9)
MPV: 7.6 fL (ref 0–99.8)
PLATELET COUNT, POC: 277 10*3/uL (ref 142–424)
POC Granulocyte: 3.1 (ref 2–6.9)
POC LYMPH PERCENT: 35.6 %L (ref 10–50)
POC MID %: 11.6 %M (ref 0–12)
RBC: 5.41 M/uL (ref 4.69–6.13)
RDW, POC: 13 %
WBC: 5.8 10*3/uL (ref 4.6–10.2)

## 2015-04-30 NOTE — Progress Notes (Signed)
Subjective:   This chart was scribed for Christopher Queen, MD by Thea Alken, ED Scribe. This patient was seen in room 4 and the patient's care was started at 8:35 AM.   Patient ID: Christopher Bolton, male    DOB: 1961-03-07, 54 y.o.   MRN: [id number]  Allergic Reaction Associated symptoms include abdominal pain and a rash.     Chief Complaint  Patient presents with  . Follow-up    Diverticulitis  . Allergic Reaction    Rash on trunk   HPI Comments: Christopher Bolton is a 54 y.o. male who presents to the [facility] for a follow up regarding diverticulitis. Pt states he still has mild abdominal soreness that he had initially rated a 5/10 at last visit but is now 3/10.  Pt usually has 1 BM a day in the morning but had increased BM;s consisting formed stool 4 days ago. Pt states he also developed an itchy erythematous rash yesterday morning to chest and abdomen. He thought it was due to Cipro so he stopped taking medication yesterday. Pt has taken Cipro in the past for pneumonia twice without complications. Pt states he finished flagyl 2 days ago. Pt has not had colonoscopy but plans schedule one soon.  He denies fevers and chills.   Patient Active Problem List   Diagnosis Date Noted  . Diverticulitis of colon 04/23/2015  . Kidney stones 04/21/2015  . DDD (degenerative disc disease), cervical 02/27/2013  . HTN (hypertension) 02/27/2013  . Allergic rhinitis 02/27/2013   Past Medical History  Diagnosis Date  . Allergy   . Depression   . Asthma   . Hypertension   . Ureteral stone   . Dysuria-frequency syndrome   . History of nausea     related to stone  . Degenerative joint disease 2012  . Attention deficit disorder    Past Surgical History  Procedure Laterality Date  . Prk Left   . Cholecystectomy    . Cystoscopy/retrograde/ureteroscopy  10/08/2012    Procedure: CYSTOSCOPY/RETROGRADE/URETEROSCOPY;  Surgeon: [name], MD;  Location: [facility];  Service: Urology;   Laterality: Left;  stent   . Trigger finger release    . Cystoscopy with retrograde pyelogram, ureteroscopy and stent placement  10/27/2012    Procedure: [place], URETEROSCOPY AND STENT PLACEMENT;  Surgeon: [name], MD;  Location: [facility];  Service: Urology;  Laterality: Left;  . Holmium laser application  00/92/3300    Procedure: HOLMIUM LASER APPLICATION;  Surgeon: [name], MD;  Location: [facility];  Service: Urology;  Laterality: Left;  . Cystoscopy w/ ureteral stent removal  10/27/2012    Procedure: CYSTOSCOPY WITH STENT REMOVAL;  Surgeon: [name], MD;  Location: [facility];  Service: Urology;  Laterality: Left;  . Cervical fusion  2014   No Known Allergies Prior to Admission medications   Medication Sig Start Date End Date Taking? Authorizing Provider  amphetamine-dextroamphetamine (ADDERALL XR) 10 MG 24 hr capsule Take 10 mg by mouth every morning. Pt is on a  Staggered dosage.  Taking 5 tablets beginning on Monday, tapering down 1 tablet daily   Yes Historical Provider, MD  buPROPion (WELLBUTRIN XL) 150 MG 24 hr tablet Take 450 mg by mouth daily.   Yes Historical Provider, MD  ciprofloxacin (CIPRO) 500 MG tablet Take 1 tablet (500 mg total) by mouth 2 (two) times daily. 04/21/15  Yes Darlyne Russian, MD  levocetirizine (XYZAL) 5 MG tablet  Take 1 tablet (5 mg total) by mouth every evening. 01/23/15  Yes Darlyne Russian, MD  losartan (COZAAR) 50 MG tablet Take 1 tablet (50 mg total) by mouth daily. 01/23/15  Yes Darlyne Russian, MD  sildenafil (REVATIO) 20 MG tablet Take 2-3 tablets one hour prior to intercourse. 01/23/15  Yes Darlyne Russian, MD  albuterol (PROVENTIL HFA;VENTOLIN HFA) 108 (90 BASE) MCG/ACT inhaler Inhale 2 puffs into the lungs every 4 (four) hours as needed. 01/26/12 04/21/15  Darlyne Russian, MD  gabapentin (NEURONTIN) 300 MG capsule Start one tablet at bedtime for 3-4 days and if  tolerated increase to 1 tablet in the morning and one tablet at bedtime. Over the next 2 weeks you  can increase to one tablet in the morning and 2 tablets at bedtime. Patient not taking: Reported on 04/23/2015 07/23/14   Darlyne Russian, MD    Review of Systems  Constitutional: Negative for fever and chills.  Gastrointestinal: Positive for abdominal pain. Negative for constipation and blood in stool.  Skin: Positive for color change and rash.   Objective:   Physical Exam CONSTITUTIONAL: Well developed/well nourished HEAD: Normocephalic/atraumatic EYES: EOMI/PERRL ENMT: Mucous membranes moist NECK: supple no meningeal signs SPINE/BACK:entire spine nontender CV: S1/S2 noted, no murmurs/rubs/gallops noted LUNGS: Lungs are clear to auscultation bilaterally, no apparent distress ABDOMEN: soft, nontender, no rebound or guarding, bowel sounds noted throughout abdomen No distention. Mild tenderness deep LLQ. GU:no cva tenderness NEURO: Pt is awake/alert/appropriate, moves all extremitiesx4.  No facial droop.   EXTREMITIES: pulses normal/equal, full ROM SKIN: warm, color normal, fine macular pap rash involving the trunk.  PSYCH: no abnormalities of mood noted, alert and oriented to situation'  Filed Vitals:   04/30/15 0822  BP: 120/76  Pulse: 101  Temp: 98.9 F (37.2 C)  TempSrc: Oral  Resp: 16  Height: 5\' 9"  (1.753 m)  Weight: 223 lb (101.152 kg)  SpO2: 98%   Results for orders placed or performed in visit on 04/30/15  POCT CBC  Result Value Ref Range   WBC 5.8 4.6 - 10.2 K/uL   Lymph, poc 2.1 0.6 - 3.4   POC LYMPH PERCENT 35.6 10 - 50 %L   MID (cbc) 0.7 0 - 0.9   POC MID % 11.6 0 - 12 %M   POC Granulocyte 3.1 2 - 6.9   Granulocyte percent 52.8 37 - 80 %G   RBC 5.41 4.69 - 6.13 M/uL   Hemoglobin 15.5 14.1 - 18.1 g/dL   HCT, POC 47.9 43.5 - 53.7 %   MCV 88.6 80 - 97 fL   MCH, POC 28.7 27 - 31.2 pg   MCHC 32.3 31.8 - 35.4 g/dL   RDW, POC 13.0 %   Platelet Count, POC 277 142  - 424 K/uL   MPV 7.6 0 - 99.8 fL   Assessment & Plan:   It appears he is having allergic reaction to his Vicente Males biotics. Most likely the Cipro. I put him down is allergic to Cipro. He is to be on a low fiber diet for now. We'll recheck in one week. He is too close to his episode of diverticulitis to have his colonoscopy yet. We'll see in one week. He was given the danger signs regarding acute diverticulitis with rupture.  I personally performed the services described in this documentation, which was scribed in my presence. The recorded information has been reviewed and is accurate.  Nena Jordan, MD

## 2015-04-30 NOTE — Progress Notes (Signed)
Subjective:   This chart was scribed for Christopher Queen, MD by Christopher Bolton, ED Scribe. This patient was seen in room 4 and the patient's care was started at 8:35 AM.   Patient ID: Christopher Bolton, male    DOB: 08/25/1961, 54 y.o.   MRN: [id number]  HPI   Chief Complaint  Patient presents with   Follow-up    Diverticulitis   Allergic Reaction    Rash on trunk   HPI Comments: Christopher Bolton is a 54 y.o. male who presents to the [facility] for a follow up regarding diverticulitis. Pt states he still has mild abdominal soreness that he had initially rated a 5/10 at last visit but is now 3/10.  Pt usually has 1 BM a day in the morning but had increased BM;s consisting formed stool 4 days ago. Pt states he also developed an itchy erythematous rash yesterday morning to chest and abdomen. He thought it was due to Cipro so he stopped taking medication yesterday. Pt has taken Cipro in the past for pneumonia twice without complications. Pt states he finished flagyl 2 days ago. Pt has not had colonoscopy but plans schedule one soon.  He denies fevers and chills.   Patient Active Problem List   Diagnosis Date Noted   Diverticulitis of colon 04/23/2015   Kidney stones 04/21/2015   DDD (degenerative disc disease), cervical 02/27/2013   HTN (hypertension) 02/27/2013   Allergic rhinitis 02/27/2013   Past Medical History  Diagnosis Date   Allergy    Depression    Asthma    Hypertension    Ureteral stone    Dysuria-frequency syndrome    History of nausea     related to stone   Degenerative joint disease 2012   Attention deficit disorder    Past Surgical History  Procedure Laterality Date   Prk Left    Cholecystectomy     Cystoscopy/retrograde/ureteroscopy  10/08/2012    Procedure: CYSTOSCOPY/RETROGRADE/URETEROSCOPY;  Surgeon: [name], MD;  Location: [facility];  Service: Urology;  Laterality: Left;  stent    Trigger finger release     Cystoscopy  with retrograde pyelogram, ureteroscopy and stent placement  10/27/2012    Procedure: CYSTOSCOPY WITH RETROGRADE PYELOGRAM, URETEROSCOPY AND STENT PLACEMENT;  Surgeon: [name], MD;  Location: [facility];  Service: Urology;  Laterality: Left;   Holmium laser application  83/41/9622    Procedure: HOLMIUM LASER APPLICATION;  Surgeon: [name], MD;  Location: [facility];  Service: Urology;  Laterality: Left;   Cystoscopy w/ ureteral stent removal  10/27/2012    Procedure: CYSTOSCOPY WITH STENT REMOVAL;  Surgeon: [name], MD;  Location: [facility];  Service: Urology;  Laterality: Left;   Cervical fusion  2014   No Known Allergies Prior to Admission medications   Medication Sig Start Date End Date Taking? Authorizing Provider  amphetamine-dextroamphetamine (ADDERALL XR) 10 MG 24 hr capsule Take 10 mg by mouth every morning. Pt is on a  Staggered dosage.  Taking 5 tablets beginning on Monday, tapering down 1 tablet daily   Yes Historical Provider, MD  buPROPion (WELLBUTRIN XL) 150 MG 24 hr tablet Take 450 mg by mouth daily.   Yes Historical Provider, MD  ciprofloxacin (CIPRO) 500 MG tablet Take 1 tablet (500 mg total) by mouth 2 (two) times daily. 04/21/15  Yes Darlyne Russian, MD  levocetirizine (XYZAL) 5 MG tablet Take 1 tablet (5 mg total) by mouth every evening. 01/23/15  Yes Darlyne Russian, MD  losartan (COZAAR) 50 MG tablet Take 1 tablet (50 mg total) by mouth daily. 01/23/15  Yes Darlyne Russian, MD  sildenafil (REVATIO) 20 MG tablet Take 2-3 tablets one hour prior to intercourse. 01/23/15  Yes Darlyne Russian, MD  albuterol (PROVENTIL HFA;VENTOLIN HFA) 108 (90 BASE) MCG/ACT inhaler Inhale 2 puffs into the lungs every 4 (four) hours as needed. 01/26/12 04/21/15  Darlyne Russian, MD  gabapentin (NEURONTIN) 300 MG capsule Start one tablet at bedtime for 3-4 days and if tolerated increase to 1 tablet in the morning and one tablet at bedtime. Over  the next 2 weeks you  can increase to one tablet in the morning and 2 tablets at bedtime. Patient not taking: Reported on 04/23/2015 07/23/14   Darlyne Russian, MD    Review of Systems  Constitutional: Negative for fever and chills.  Gastrointestinal: Positive for abdominal pain. Negative for constipation and blood in stool.  Skin: Positive for color change and rash.   Objective:   Physical Exam CONSTITUTIONAL: Well developed/well nourished HEAD: Normocephalic/atraumatic EYES: EOMI/PERRL ENMT: Mucous membranes moist NECK: supple no meningeal signs SPINE/BACK:entire spine nontender CV: S1/S2 noted, no murmurs/rubs/gallops noted LUNGS: Lungs are clear to auscultation bilaterally, no apparent distress ABDOMEN: soft, nontender, no rebound or guarding, bowel sounds noted throughout abdomen No distention. Mild tenderness deep LLQ. GU:no cva tenderness NEURO: Pt is awake/alert/appropriate, moves all extremitiesx4.  No facial droop.   EXTREMITIES: pulses normal/equal, full ROM SKIN: warm, color normal, fine macular pap rash involving the trunk.  PSYCH: no abnormalities of mood noted, alert and oriented to situation'  Filed Vitals:   04/30/15 0822  BP: 120/76  Pulse: 101  Temp: 98.9 F (37.2 C)  TempSrc: Oral  Resp: 16  Height: 5\' 9"  (1.753 m)  Weight: 223 lb (101.152 kg)  SpO2: 98%   Results for orders placed or performed in visit on 04/30/15  POCT CBC  Result Value Ref Range   WBC 5.8 4.6 - 10.2 K/uL   Lymph, poc 2.1 0.6 - 3.4   POC LYMPH PERCENT 35.6 10 - 50 %L   MID (cbc) 0.7 0 - 0.9   POC MID % 11.6 0 - 12 %M   POC Granulocyte 3.1 2 - 6.9   Granulocyte percent 52.8 37 - 80 %G   RBC 5.41 4.69 - 6.13 M/uL   Hemoglobin 15.5 14.1 - 18.1 g/dL   HCT, POC 47.9 43.5 - 53.7 %   MCV 88.6 80 - 97 fL   MCH, POC 28.7 27 - 31.2 pg   MCHC 32.3 31.8 - 35.4 g/dL   RDW, POC 13.0 %   Platelet Count, POC 277 142 - 424 K/uL   MPV 7.6 0 - 99.8 fL   Assessment & Plan:   It appears he is  having allergic reaction to his Vicente Males biotics. Most likely the Cipro. I put him down is allergic to Cipro. He is to be on a low fiber diet for now. We'll recheck in one week. He is too close to his episode of diverticulitis to have his colonoscopy yet. We'll see in one week. He was given the danger signs regarding acute diverticulitis with rupture.  I personally performed the services described in this documentation, which was scribed in my presence. The recorded information has been reviewed and is accurate.  Nena Jordan, MD

## 2015-04-30 NOTE — Patient Instructions (Signed)
Please stop your Antibiotics. Take probiotics twice a day. Take Zyrtec 10 mg 1 a day. He can take Benadryl at night as needed. See me in one weekDiverticulitis Diverticulitis is inflammation or infection of small pouches in your colon that form when you have a condition called diverticulosis. The pouches in your colon are called diverticula. Your colon, or large intestine, is where water is absorbed and stool is formed. Complications of diverticulitis can include:  Bleeding.  Severe infection.  Severe pain.  Perforation of your colon.  Obstruction of your colon. CAUSES  Diverticulitis is caused by bacteria. Diverticulitis happens when stool becomes trapped in diverticula. This allows bacteria to grow in the diverticula, which can lead to inflammation and infection. RISK FACTORS People with diverticulosis are at risk for diverticulitis. Eating a diet that does not include enough fiber from fruits and vegetables may make diverticulitis more likely to develop. SYMPTOMS  Symptoms of diverticulitis may include:  Abdominal pain and tenderness. The pain is normally located on the left side of the abdomen, but may occur in other areas.  Fever and chills.  Bloating.  Cramping.  Nausea.  Vomiting.  Constipation.  Diarrhea.  Blood in your stool. DIAGNOSIS  Your health care provider will ask you about your medical history and do a physical exam. You may need to have tests done because many medical conditions can cause the same symptoms as diverticulitis. Tests may include:  Blood tests.  Urine tests.  Imaging tests of the abdomen, including X-rays and CT scans. When your condition is under control, your health care provider may recommend that you have a colonoscopy. A colonoscopy can show how severe your diverticula are and whether something else is causing your symptoms. TREATMENT  Most cases of diverticulitis are mild and can be treated at home. Treatment may  include:  Taking over-the-counter pain medicines.  Following a clear liquid diet.  Taking antibiotic medicines by mouth for 7-10 days. More severe cases may be treated at a hospital. Treatment may include:  Not eating or drinking.  Taking prescription pain medicine.  Receiving antibiotic medicines through an IV tube.  Receiving fluids and nutrition through an IV tube.  Surgery. HOME CARE INSTRUCTIONS   Follow your health care provider's instructions carefully.  Follow a full liquid diet or other diet as directed by your health care provider. After your symptoms improve, your health care provider may tell you to change your diet. He or she may recommend you eat a high-fiber diet. Fruits and vegetables are good sources of fiber. Fiber makes it easier to pass stool.  Take fiber supplements or probiotics as directed by your health care provider.  Only take medicines as directed by your health care provider.  Keep all your follow-up appointments. SEEK MEDICAL CARE IF:   Your pain does not improve.  You have a hard time eating food.  Your bowel movements do not return to normal. SEEK IMMEDIATE MEDICAL CARE IF:   Your pain becomes worse.  Your symptoms do not get better.  Your symptoms suddenly get worse.  You have a fever.  You have repeated vomiting.  You have bloody or black, tarry stools. MAKE SURE YOU:   Understand these instructions.  Will watch your condition.  Will get help right away if you are not doing well or get worse. Document Released: 08/04/2005 Document Revised: 10/30/2013 Document Reviewed: 09/19/2013 [facility] Patient Information 2015 [place], [place]. This information is not intended to replace advice given to you by your health care  provider. Make sure you discuss any questions you have with your health care provider.

## 2015-05-06 ENCOUNTER — Other Ambulatory Visit: Payer: Self-pay | Admitting: Emergency Medicine

## 2015-05-06 ENCOUNTER — Encounter: Payer: Self-pay | Admitting: Emergency Medicine

## 2015-05-06 ENCOUNTER — Telehealth: Payer: Self-pay | Admitting: Emergency Medicine

## 2015-05-06 MED ORDER — AMOXICILLIN-POT CLAVULANATE 875-125 MG PO TABS: 1.0000 | ORAL_TABLET | Freq: Two times a day (BID) | ORAL | Status: DC

## 2015-05-06 NOTE — Telephone Encounter (Signed)
Called and spoke with patient. He has persistent left lower quadrant abdominal pain. He was allergic to Cipro. Will treat with Augmentin twice a day and follow-up on either Wednesday or Friday.

## 2015-05-09 ENCOUNTER — Ambulatory Visit (INDEPENDENT_AMBULATORY_CARE_PROVIDER_SITE_OTHER): Payer: 59 | Admitting: Emergency Medicine

## 2015-05-09 ENCOUNTER — Encounter: Payer: Self-pay | Admitting: Emergency Medicine

## 2015-05-09 VITALS — BP 120/76 | HR 74 | Temp 98.2°F | Resp 18 | Ht 69.0 in | Wt 220.0 lb

## 2015-05-09 DIAGNOSIS — K5732 Diverticulitis of large intestine without perforation or abscess without bleeding: Secondary | ICD-10-CM | POA: Diagnosis not present

## 2015-05-09 LAB — POCT CBC
GRANULOCYTE PERCENT: 53 % (ref 37–80)
HEMATOCRIT: 45.4 % (ref 43.5–53.7)
Hemoglobin: 14.6 g/dL (ref 14.1–18.1)
LYMPH, POC: 1.9 (ref 0.6–3.4)
MCH, POC: 28.5 pg (ref 27–31.2)
MCHC: 32.1 g/dL (ref 31.8–35.4)
MCV: 89 fL (ref 80–97)
MID (CBC): 0.4 (ref 0–0.9)
MPV: 7.7 fL (ref 0–99.8)
POC Granulocyte: 2.5 (ref 2–6.9)
POC LYMPH PERCENT: 39.5 %L (ref 10–50)
POC MID %: 7.5 % (ref 0–12)
Platelet Count, POC: 306 10*3/uL (ref 142–424)
RBC: 5.11 M/uL (ref 4.69–6.13)
RDW, POC: 12.8 %
WBC: 4.7 10*3/uL (ref 4.6–10.2)

## 2015-05-09 LAB — POCT SEDIMENTATION RATE: POCT SED RATE: 22 mm/h (ref 0–22)

## 2015-05-09 MED ORDER — AMOXICILLIN-POT CLAVULANATE 875-125 MG PO TABS: 1.0000 | ORAL_TABLET | Freq: Two times a day (BID) | ORAL | Status: DC

## 2015-05-09 NOTE — Patient Instructions (Signed)

## 2015-05-09 NOTE — Progress Notes (Addendum)
Subjective:  This chart was scribed for [name], MD by Leandra Kern, Medical Scribe. This patient was seen in Room 8 and the patient's care was started at 10:07 AM.   Patient ID: Christopher Bolton, male    DOB: [birth year], 54 y.o.   MRN: [id number]  HPI HPI Comments: Christopher Bolton is a 54 y.o. male who presents to [facility] for a follow up regarding diverticulitis.  He notes that the area is still tender. Pt states that he does feel relief after starting taking antibiotics within a 12 hour period. He indicates  Pt indicates that the pain severity is about 2/10 most of the times, however it gets to 3.5/10 at times. He denies fever, vomiting, or bowel problems. Pt notes that he is taking probiotics, and he is aware of the activities that could aggravate the pain. Pt states that his sleeping quality has not changed from baseline. He states that he feels fine otherwise. Pt notes that he has FHx of multiple abdominal problems.   Pt works 8-5, Monday- Friday.   Review of Systems  Constitutional: Negative for fever.  Gastrointestinal: Positive for abdominal pain. Negative for vomiting, constipation and blood in stool.  Psychiatric/Behavioral: Negative for sleep disturbance.      Objective:   Physical Exam  Constitutional: He is oriented to person, place, and time. He appears well-developed and well-nourished. No distress.  HENT:  Head: Normocephalic and atraumatic.  Eyes: EOM are normal. Pupils are equal, round, and reactive to light.  Neck: Neck supple.  Cardiovascular: Normal rate and regular rhythm.   Pulmonary/Chest: Effort normal and breath sounds normal.  Abdominal: There is tenderness. There is guarding.  Tender deep left lower quadrant with mild guarding    Neurological: He is alert and oriented to person, place, and time. No cranial nerve deficit.  Skin: Skin is warm and dry.  Psychiatric: He has a normal mood and affect. His behavior is normal.  Nursing  note and vitals reviewed.  Results for orders placed or performed in visit on 05/09/15  POCT CBC  Result Value Ref Range   WBC 4.7 4.6 - 10.2 K/uL   Lymph, poc 1.9 0.6 - 3.4   POC LYMPH PERCENT 39.5 10 - 50 %L   MID (cbc) 0.4 0 - 0.9   POC MID % 7.5 0 - 12 %M   POC Granulocyte 2.5 2 - 6.9   Granulocyte percent 53.0 37 - 80 %G   RBC 5.11 4.69 - 6.13 M/uL   Hemoglobin 14.6 14.1 - 18.1 g/dL   HCT, POC 45.4 43.5 - 53.7 %   MCV 89.0 80 - 97 fL   MCH, POC 28.5 27 - 31.2 pg   MCHC 32.1 31.8 - 35.4 g/dL   RDW, POC 12.8 %   Platelet Count, POC 306 142 - 424 K/uL   MPV 7.7 0 - 99.8 fL   Results for orders placed or performed in visit on 05/09/15  POCT SEDIMENTATION RATE  Result Value Ref Range   POCT SED RATE 22 0 - 22 mm/hr  POCT CBC  Result Value Ref Range   WBC 4.7 4.6 - 10.2 K/uL   Lymph, poc 1.9 0.6 - 3.4   POC LYMPH PERCENT 39.5 10 - 50 %L   MID (cbc) 0.4 0 - 0.9   POC MID % 7.5 0 - 12 %M   POC Granulocyte 2.5 2 - 6.9   Granulocyte percent 53.0 37 - 80 %G  RBC 5.11 4.69 - 6.13 M/uL   Hemoglobin 14.6 14.1 - 18.1 g/dL   HCT, POC 45.4 43.5 - 53.7 %   MCV 89.0 80 - 97 fL   MCH, POC 28.5 27 - 31.2 pg   MCHC 32.1 31.8 - 35.4 g/dL   RDW, POC 12.8 %   Platelet Count, POC 306 142 - 424 K/uL   MPV 7.7 0 - 99.8 fL      Assessment & Plan:  Patient feels better since getting on Augmentin. He is afebrile and states he has had no fever. Heart rate is normal. Will check a CBC and sedimentation rate. His CBC has been normal throughout the illness. I will recheck him in one week. I do think we will probably have to rescan him before stopping antibiotics I personally performed the services described in this documentation, which was scribed in my presence. The recorded information has been reviewed and is accurate. A sedimentation rate of 22 and normal white count is reassuring .  Nena Jordan, MD

## 2015-05-14 ENCOUNTER — Telehealth: Payer: Self-pay | Admitting: Emergency Medicine

## 2015-05-14 ENCOUNTER — Encounter: Payer: Self-pay | Admitting: Emergency Medicine

## 2015-05-14 ENCOUNTER — Other Ambulatory Visit: Payer: Self-pay | Admitting: Emergency Medicine

## 2015-05-14 DIAGNOSIS — K5732 Diverticulitis of large intestine without perforation or abscess without bleeding: Secondary | ICD-10-CM

## 2015-05-14 NOTE — Telephone Encounter (Signed)
Patient called we'll proceed with CT abdomen pelvis. I spoke with him personally.

## 2015-05-15 ENCOUNTER — Other Ambulatory Visit: Payer: Self-pay | Admitting: Emergency Medicine

## 2015-05-15 ENCOUNTER — Ambulatory Visit
Admission: RE | Admit: 2015-05-15 | Discharge: 2015-05-15 | Disposition: A | Discharge status: Home / Self Care | Payer: 59 | Source: Ambulatory Visit | Attending: Emergency Medicine | Admitting: Emergency Medicine

## 2015-05-15 DIAGNOSIS — K5732 Diverticulitis of large intestine without perforation or abscess without bleeding: Secondary | ICD-10-CM

## 2015-05-15 MED ORDER — AMOXICILLIN-POT CLAVULANATE 875-125 MG PO TABS: 1.0000 | ORAL_TABLET | Freq: Two times a day (BID) | ORAL | Status: DC

## 2015-05-15 MED ORDER — IOPAMIDOL (ISOVUE-300) INJECTION 61%
123.0000 mL | Freq: Once | INTRAVENOUS | Status: AC | PRN
  Administered 2015-05-15: 123 mL via INTRAVENOUS

## 2015-05-16 ENCOUNTER — Telehealth: Payer: Self-pay | Admitting: Internal Medicine

## 2015-05-16 ENCOUNTER — Other Ambulatory Visit: Payer: Self-pay | Admitting: Emergency Medicine

## 2015-05-16 ENCOUNTER — Encounter: Payer: Self-pay | Admitting: Emergency Medicine

## 2015-05-16 ENCOUNTER — Telehealth: Payer: Self-pay | Admitting: *Deleted

## 2015-05-16 DIAGNOSIS — R935 Abnormal findings on diagnostic imaging of other abdominal regions, including retroperitoneum: Secondary | ICD-10-CM

## 2015-05-16 NOTE — Telephone Encounter (Signed)
Patient has persistent diverticulitis - slowly getting better on Augmentin after having cipro and metronidazole w/ rxn  Please contact the patient and arrange f/u with me or an APP when I am in the office (needs to be when I am there) and let me know date

## 2015-05-16 NOTE — Telephone Encounter (Signed)
Scheduled OV on 05/27/15 at 9:15 AM. Left a message for patient to call back.

## 2015-05-16 NOTE — Telephone Encounter (Signed)
Patient given appointment date.

## 2015-05-16 NOTE — Telephone Encounter (Signed)
Documentation      Hulan Saas, RN at 05/16/2015 2:01 PM     Status: Signed       Expand All Collapse All   Scheduled OV on 05/27/15 at 9:15 AM. Left a message for patient to call back.            Gatha Mayer, MD at 05/16/2015 1:46 PM     Status: Signed       Expand All Collapse All   Patient has persistent diverticulitis - slowly getting better on Augmentin after having cipro and metronidazole w/ rxn  Please contact the patient and arrange f/u with me or an APP when I am in the office (needs to be when I am there) and let me know date

## 2015-05-16 NOTE — Telephone Encounter (Signed)
Spoke with patient and gave him appointment date.

## 2015-05-17 ENCOUNTER — Ambulatory Visit (INDEPENDENT_AMBULATORY_CARE_PROVIDER_SITE_OTHER): Payer: 59 | Admitting: Emergency Medicine

## 2015-05-17 VITALS — BP 120/70 | HR 64 | Temp 97.4°F | Resp 14 | Ht 69.0 in | Wt 218.4 lb

## 2015-05-17 DIAGNOSIS — K5732 Diverticulitis of large intestine without perforation or abscess without bleeding: Secondary | ICD-10-CM | POA: Diagnosis not present

## 2015-05-17 LAB — CBC
HEMATOCRIT: 42.3 % (ref 39.0–52.0)
HEMOGLOBIN: 15 g/dL (ref 13.0–17.0)
MCH: 30.4 pg (ref 26.0–34.0)
MCHC: 35.5 g/dL (ref 30.0–36.0)
MCV: 85.6 fL (ref 78.0–100.0)
MPV: 10 fL (ref 8.6–12.4)
Platelets: 299 10*3/uL (ref 150–400)
RBC: 4.94 MIL/uL (ref 4.22–5.81)
RDW: 12.5 % (ref 11.5–15.5)
WBC: 4.6 10*3/uL (ref 4.0–10.5)

## 2015-05-17 NOTE — Progress Notes (Signed)
    MRN: [id number] DOB: May 07, 1961  Subjective:   Christopher Bolton is a 54 y.o. male presenting for follow up on diverticulitis. Reports that his abdominal pain is improved the past 3 days. Has had increased bowel sounds especially at night per patient. Denies fever, n/v, shob, diarrhea, bloody stools. Admits that he is trying to eat light meals and watch what he eats. Patient has an appointment with Christopher Bolton, GI, on 05/27/2015. Denies any other aggravating or relieving factors, no other questions or concerns.  Christopher Bolton has a current medication list which includes the following prescription(s): amoxicillin-clavulanate, amphetamine-dextroamphetamine, bupropion, levocetirizine, losartan, montelukast, sildenafil, albuterol, and gabapentin. He is allergic to ciprofloxacin.  Christopher Bolton  has a past medical history of Allergy; Depression; Asthma; Hypertension; Ureteral stone; Dysuria-frequency syndrome; History of nausea; Degenerative joint disease (2012); Attention deficit disorder; and Diverticulitis. Also  has past surgical history that includes [place] (Left); Cholecystectomy; Cystoscopy/retrograde/ureteroscopy (10/08/2012); Trigger finger release; Cystoscopy with retrograde pyelogram, ureteroscopy and stent placement (10/27/2012); Holmium laser application (67/67/2094); Cystoscopy w/ ureteral stent removal (10/27/2012); and Cervical fusion (2014).  ROS As in subjective.  Objective:   Vitals: BP 120/70 mmHg  Pulse 64  Temp(Src) 97.4 F (36.3 C) (Oral)  Resp 14  Ht 5\' 9"  (1.753 m)  Wt 218 lb 6.4 oz (99.066 kg)  BMI 32.24 kg/m2  SpO2 98%  Physical Exam  Constitutional: He is oriented to person, place, and time. He appears well-developed and well-nourished.  Cardiovascular: Normal rate, regular rhythm and intact distal pulses.  Exam reveals no gallop and no friction rub.   No murmur heard. Pulmonary/Chest: No respiratory distress. He has no wheezes. He has no rales.  Abdominal: He exhibits no  distension and no mass. There is no tenderness.  Neurological: He is alert and oriented to person, place, and time.  Skin: Skin is warm and dry. No rash noted. No erythema. No pallor.  Psychiatric: He has a normal mood and affect.   Assessment and Plan :   1. Diverticulitis of colon - Stable, continue Augmentin, labs pending, follow up with Dr. Frederich Chick, PA-C [facility] [phone] 05/17/2015 8:32 AM

## 2015-05-17 NOTE — Patient Instructions (Signed)

## 2015-05-18 LAB — SEDIMENTATION RATE: Sed Rate: 7 mm/hr (ref 0–20)

## 2015-05-26 ENCOUNTER — Encounter: Payer: Self-pay | Admitting: Emergency Medicine

## 2015-05-26 ENCOUNTER — Other Ambulatory Visit: Payer: Self-pay | Admitting: Emergency Medicine

## 2015-05-26 MED ORDER — AMOXICILLIN-POT CLAVULANATE 875-125 MG PO TABS: 1.0000 | ORAL_TABLET | Freq: Two times a day (BID) | ORAL | Status: DC

## 2015-05-27 ENCOUNTER — Encounter: Payer: Self-pay | Admitting: Internal Medicine

## 2015-05-27 ENCOUNTER — Ambulatory Visit (INDEPENDENT_AMBULATORY_CARE_PROVIDER_SITE_OTHER): Payer: 59 | Admitting: Internal Medicine

## 2015-05-27 VITALS — BP 120/70 | HR 66 | Ht 69.0 in | Wt 215.5 lb

## 2015-05-27 DIAGNOSIS — K5732 Diverticulitis of large intestine without perforation or abscess without bleeding: Secondary | ICD-10-CM | POA: Diagnosis not present

## 2015-05-27 DIAGNOSIS — Z1211 Encounter for screening for malignant neoplasm of colon: Secondary | ICD-10-CM

## 2015-05-27 NOTE — Progress Notes (Signed)
   Subjective:    Patient ID: Christopher Bolton, male    DOB: 26-Jul-1961, 54 y.o.   MRN: [id number] Cc: diverticulitis HPI  The patient is here for evaluation of diverticulitis. He was diagnosed with sigmoid diverticulitis by his primary care provider, [name] back in June. Originally had LLQ - periumbilical  pain like kidney stone but not as severe Tx w/ cipro and metronidazole for CT dx diverticulitus - reacted to cipro  Now on 3rd Tx of Augmentin and is improving  Originally 5/10 pain - now 2.5 or less now  Some constipation - and that will increase pain - uses Senokot No diarrhea except after CT  Some urinary urgency and hesitancy  Medications, allergies, past medical history, past surgical history, family history and social history are reviewed and updated in the EMR.  Review of Systems As above.    Objective:   Physical Exam  @BP  120/70 mmHg  Pulse 66  Ht 5\' 9"  (1.753 m)  Wt 215 lb 8 oz (97.75 kg)  BMI 31.81 kg/m2@  General:  NAD Eyes:   anicteric Lungs:  clear Heart:: S1S2 no rubs, murmurs or gallops Abdomen:  soft and  Mildly tender LLQ tender, BS+ no rebound or guarding Ext:   no edema, cyanosis or clubbing    Data Reviewed:  Primary care notes, labs in the EMR and CT scans of 04/21/2015 and 05/15/2015 including images reviewed with the patient       Assessment & Plan:   1. Diverticulitis of colon   2. Colon cancer screening    Clinical scenario including history physical and imaging compatible with diverticulitis that is responding to anti-biotics but somewhat slowly. He did have a treatment Related to the reaction to Cipro.  I have recommended that he continue and finish this course of Augmentin generic, and call me back if he has persistent problems but that he should understand there could be some lag in complete relief of symptoms.  As long as things are worsening would proceed with a screening colonoscopy.The risks and benefits as well as  alternatives of endoscopic procedure(s) have been discussed and reviewed. All questions answered. The patient agrees to proceed.   I appreciate the opportunity to care for this patient. CC: DAUB, Lina Sayre, MD

## 2015-05-27 NOTE — Assessment & Plan Note (Signed)
improving

## 2015-05-27 NOTE — Patient Instructions (Addendum)
You have been scheduled for a colonoscopy. Please follow written instructions given to you at your visit today.  Please pick up your prep supplies at the pharmacy. If you use inhalers (even only as needed), please bring them with you on the day of your procedure.   Use Miralax once to twice daily, coupon provided.    I appreciate the opportunity to care for you. Silvano Rusk, MD, [facility]

## 2015-06-09 DIAGNOSIS — Z860101 Personal history of adenomatous and serrated colon polyps: Secondary | ICD-10-CM

## 2015-06-09 DIAGNOSIS — Z86010 Personal history of colonic polyps: Secondary | ICD-10-CM

## 2015-06-09 HISTORY — DX: Personal history of colonic polyps: Z86.010

## 2015-06-09 HISTORY — DX: Personal history of adenomatous and serrated colon polyps: Z86.0101

## 2015-06-30 ENCOUNTER — Encounter: Payer: Self-pay | Admitting: Internal Medicine

## 2015-06-30 ENCOUNTER — Ambulatory Visit (AMBULATORY_SURGERY_CENTER): Payer: 59 | Admitting: Internal Medicine

## 2015-06-30 VITALS — BP 123/72 | HR 58 | Temp 97.1°F | Resp 16 | Ht 69.0 in | Wt 215.0 lb

## 2015-06-30 DIAGNOSIS — Z1211 Encounter for screening for malignant neoplasm of colon: Secondary | ICD-10-CM

## 2015-06-30 DIAGNOSIS — D120 Benign neoplasm of cecum: Secondary | ICD-10-CM

## 2015-06-30 DIAGNOSIS — K5730 Diverticulosis of large intestine without perforation or abscess without bleeding: Secondary | ICD-10-CM | POA: Diagnosis not present

## 2015-06-30 DIAGNOSIS — D124 Benign neoplasm of descending colon: Secondary | ICD-10-CM

## 2015-06-30 HISTORY — PX: COLONOSCOPY WITH PROPOFOL: SHX5780

## 2015-06-30 MED ORDER — SODIUM CHLORIDE 0.9 % IV SOLN: 500.0000 mL | INTRAVENOUS | Status: DC

## 2015-06-30 MED ORDER — DICYCLOMINE HCL 20 MG PO TABS: 20.0000 mg | ORAL_TABLET | Freq: Four times a day (QID) | ORAL | Status: DC | PRN

## 2015-06-30 NOTE — Progress Notes (Signed)
To recovery, report to Brown, RN, VSS. 

## 2015-06-30 NOTE — Patient Instructions (Addendum)
I found and removed 4 polyps - all look benign. No signs of diverticulitis but you do have diverticulosis.  I will let you know pathology results and when to have another routine colonoscopy by mail.  I am prescribing a medication called dicyclomine to treat pain and cramps. It can be used as needed. If you get symptoms that fails to treat see [name] or me again.  I appreciate the opportunity to care for you. Gatha Mayer, MD, FACG    YOU HAD AN ENDOSCOPIC PROCEDURE TODAY AT [facility]:   Refer to the procedure report that was given to you for any specific questions about what was found during the examination.  If the procedure report does not answer your questions, please call your gastroenterologist to clarify.  If you requested that your care partner not be given the details of your procedure findings, then the procedure report has been included in a sealed envelope for you to review at your convenience later.  YOU SHOULD EXPECT: Some feelings of bloating in the abdomen. Passage of more gas than usual.  Walking can help get rid of the air that was put into your GI tract during the procedure and reduce the bloating. If you had a lower endoscopy (such as a colonoscopy or flexible sigmoidoscopy) you may notice spotting of blood in your stool or on the toilet paper. If you underwent a bowel prep for your procedure, you may not have a normal bowel movement for a few days.  Please Note:  You might notice some irritation and congestion in your nose or some drainage.  This is from the oxygen used during your procedure.  There is no need for concern and it should clear up in a day or so.  SYMPTOMS TO REPORT IMMEDIATELY:   Following lower endoscopy (colonoscopy or flexible sigmoidoscopy):  Excessive amounts of blood in the stool  Significant tenderness or worsening of abdominal pains  Swelling of the abdomen that is new, acute  Fever of 100F or higher   For urgent  or emergent issues, a gastroenterologist can be reached at any hour by calling [phone].   DIET: Your first meal following the procedure should be a small meal and then it is ok to progress to your normal diet. Heavy or fried foods are harder to digest and may make you feel nauseous or bloated.  Likewise, meals heavy in dairy and vegetables can increase bloating.  Drink plenty of fluids but you should avoid alcoholic beverages for 24 hours.  ACTIVITY:  You should plan to take it easy for the rest of today and you should NOT DRIVE or use heavy machinery until tomorrow (because of the sedation medicines used during the test).    FOLLOW UP: Our staff will call the number listed on your records the next business day following your procedure to check on you and address any questions or concerns that you may have regarding the information given to you following your procedure. If we do not reach you, we will leave a message.  However, if you are feeling well and you are not experiencing any problems, there is no need to return our call.  We will assume that you have returned to your regular daily activities without incident.  If any biopsies were taken you will be contacted by phone or by letter within the next 1-3 weeks.  Please call us at [phone] if you have not heard about the biopsies in 3  weeks.    SIGNATURES/CONFIDENTIALITY: You and/or your care partner have signed paperwork which will be entered into your electronic medical record.  These signatures attest to the fact that that the information above on your After Visit Summary has been reviewed and is understood.  Full responsibility of the confidentiality of this discharge information lies with you and/or your care-partner.    Resume medications. Information given on polyps and diverticulosis.

## 2015-06-30 NOTE — Progress Notes (Signed)
Called to room to assist during endoscopic procedure.  Patient ID and intended procedure confirmed with present staff. Received instructions for my participation in the procedure from the performing physician.  

## 2015-06-30 NOTE — Op Note (Signed)
[place]  [address] [place], [id number]   COLONOSCOPY PROCEDURE REPORT  PATIENT: Christopher Bolton, Christopher Bolton  MR#: [id number] BIRTHDATE: December 31, 1960 , 58  yrs. old GENDER: male ENDOSCOPIST: Gatha Mayer, MD, [facility] PROCEDURE DATE:  06/30/2015 PROCEDURE:   Colonoscopy, screening, Colonoscopy with biopsy, and Colonoscopy with snare polypectomy First Screening Colonoscopy - Avg.  risk and is 50 yrs.  old or older Yes.  Prior Negative Screening - Now for repeat screening. N/A  History of Adenoma - Now for follow-up colonoscopy & has been > or = to 3 yrs.  N/A  Polyps removed today? Yes ASA CLASS:   Class III INDICATIONS:Screening for colonic neoplasia and Colorectal Neoplasm Risk Assessment for this procedure is average risk. MEDICATIONS: Propofol 300 mg IV and Monitored anesthesia care  DESCRIPTION OF PROCEDURE:   After the risks benefits and alternatives of the procedure were thoroughly explained, informed consent was obtained.  The digital rectal exam revealed no abnormalities of the rectum, revealed no prostatic nodules, and revealed the prostate was not enlarged.   The     endoscope was introduced through the anus and advanced to the cecum, which was identified by both the appendix and ileocecal valve. No adverse events experienced.   The quality of the prep was good.  (MiraLax was used)  The instrument was then slowly withdrawn as the colon was fully examined. Estimated blood loss is zero unless otherwise noted in this procedure report.  COLON FINDINGS: Three polypoid shaped sessile polyps ranging from 1 to 47mm in size were found at the cecum.  Polypectomies were performed with cold forceps.  The resection was complete, the polyp tissue was completely retrieved and sent to histology.   A polypoid shaped pedunculated polyp measuring 10 mm in size was found in the descending colon.  A polypectomy was performed using snare cautery. The resection was complete, the polyp  tissue was completely retrieved and sent to histology.   There was severe diverticulosis noted in the sigmoid colon with associated muscular hypertrophy. The examination was otherwise normal.  Retroflexed views revealed no abnormalities. The time to cecum = 2.1 Withdrawal time = 11.7 The scope was withdrawn and the procedure completed. COMPLICATIONS: There were no immediate complications. ENDOSCOPIC IMPRESSION: 1.   Three sessile polyps ranging from 1 to 29mm in size were found at the cecum; polypectomies were performed with cold forceps 2.   Pedunculated polyp was found in the descending colon; polypectomy was performed using snare cautery 3.   There was severe diverticulosis noted in the sigmoid colon - no diverticulitis (has had dx by CT 1-2 months ago) 4.   The examination was otherwise normal  RECOMMENDATIONS: 1.  Hold Aspirin and all other NSAIDS for 2 weeks. 2.  Timing of repeat colonoscopy will be determined by pathology findings. 3.  Dicyclomine 20 mg prn abdominal pain suspect some symptomatic diverticulosis now, - f/u PCP or GI if persistent pains that does not treat eSigned:  Gatha Mayer, MD, [facility] 06/30/2015 7:58 AM  cc: Nena Jordan, MD and The Patient

## 2015-07-01 ENCOUNTER — Telehealth: Payer: Self-pay | Admitting: *Deleted

## 2015-07-01 NOTE — Telephone Encounter (Signed)
  Follow up Call-  Call back number 06/30/2015  Post procedure Call Back phone  # [phone]  Permission to leave phone message Yes     Patient questions:  Message left to call us if necessary.

## 2015-07-03 ENCOUNTER — Encounter: Payer: Self-pay | Admitting: Internal Medicine

## 2015-07-03 DIAGNOSIS — Z860101 Personal history of adenomatous and serrated colon polyps: Secondary | ICD-10-CM | POA: Insufficient documentation

## 2015-07-03 DIAGNOSIS — Z86010 Personal history of colonic polyps: Secondary | ICD-10-CM

## 2015-07-03 NOTE — Progress Notes (Signed)
Quick Note:  4 adenomas max 10 mm - repeat colonoscopy 2019 ______

## 2015-07-31 ENCOUNTER — Other Ambulatory Visit: Payer: Self-pay | Admitting: Emergency Medicine

## 2015-07-31 ENCOUNTER — Ambulatory Visit (INDEPENDENT_AMBULATORY_CARE_PROVIDER_SITE_OTHER): Payer: 59 | Admitting: Emergency Medicine

## 2015-07-31 ENCOUNTER — Encounter: Payer: Self-pay | Admitting: Emergency Medicine

## 2015-07-31 VITALS — BP 112/74 | HR 73 | Temp 98.4°F | Resp 16 | Ht 68.5 in | Wt 210.0 lb

## 2015-07-31 DIAGNOSIS — Z23 Encounter for immunization: Secondary | ICD-10-CM

## 2015-07-31 DIAGNOSIS — Z0000 Encounter for general adult medical examination without abnormal findings: Secondary | ICD-10-CM

## 2015-07-31 DIAGNOSIS — Z13220 Encounter for screening for lipoid disorders: Secondary | ICD-10-CM | POA: Diagnosis not present

## 2015-07-31 DIAGNOSIS — K5732 Diverticulitis of large intestine without perforation or abscess without bleeding: Secondary | ICD-10-CM | POA: Diagnosis not present

## 2015-07-31 DIAGNOSIS — N528 Other male erectile dysfunction: Secondary | ICD-10-CM | POA: Diagnosis not present

## 2015-07-31 DIAGNOSIS — R5382 Chronic fatigue, unspecified: Secondary | ICD-10-CM

## 2015-07-31 DIAGNOSIS — I10 Essential (primary) hypertension: Secondary | ICD-10-CM | POA: Diagnosis not present

## 2015-07-31 DIAGNOSIS — M542 Cervicalgia: Secondary | ICD-10-CM | POA: Diagnosis not present

## 2015-07-31 LAB — CBC WITH DIFFERENTIAL/PLATELET
BASOS ABS: 0 10*3/uL (ref 0.0–0.1)
Basophils Relative: 0 % (ref 0–1)
EOS ABS: 0.1 10*3/uL (ref 0.0–0.7)
Eosinophils Relative: 2 % (ref 0–5)
HCT: 44.9 % (ref 39.0–52.0)
Hemoglobin: 16.1 g/dL (ref 13.0–17.0)
LYMPHS ABS: 2 10*3/uL (ref 0.7–4.0)
Lymphocytes Relative: 32 % (ref 12–46)
MCH: 30.7 pg (ref 26.0–34.0)
MCHC: 35.9 g/dL (ref 30.0–36.0)
MCV: 85.7 fL (ref 78.0–100.0)
MPV: 10.2 fL (ref 8.6–12.4)
Monocytes Absolute: 0.6 10*3/uL (ref 0.1–1.0)
Monocytes Relative: 9 % (ref 3–12)
NEUTROS ABS: 3.5 10*3/uL (ref 1.7–7.7)
NEUTROS PCT: 57 % (ref 43–77)
PLATELETS: 246 10*3/uL (ref 150–400)
RBC: 5.24 MIL/uL (ref 4.22–5.81)
RDW: 13.6 % (ref 11.5–15.5)
WBC: 6.2 10*3/uL (ref 4.0–10.5)

## 2015-07-31 LAB — LIPID PANEL
Cholesterol: 179 mg/dL (ref 125–200)
HDL: 42 mg/dL (ref >=40)
LDL Cholesterol: 119 mg/dL (ref <130)
TRIGLYCERIDES: 90 mg/dL (ref <150)
Total CHOL/HDL Ratio: 4.3 Ratio (ref <=5.0)
VLDL: 18 mg/dL (ref <30)

## 2015-07-31 LAB — COMPLETE METABOLIC PANEL WITH GFR
ALBUMIN: 4.3 g/dL (ref 3.6–5.1)
ALK PHOS: 81 U/L (ref 40–115)
ALT: 27 U/L (ref 9–46)
AST: 18 U/L (ref 10–35)
BILIRUBIN TOTAL: 1 mg/dL (ref 0.2–1.2)
BUN: 12 mg/dL (ref 7–25)
CO2: 28 mmol/L (ref 20–31)
CREATININE: 1.15 mg/dL (ref 0.70–1.33)
Calcium: 9.2 mg/dL (ref 8.6–10.3)
Chloride: 102 mmol/L (ref 98–110)
GFR, EST AFRICAN AMERICAN: 83 mL/min (ref >=60)
GFR, Est Non African American: 72 mL/min (ref >=60)
Glucose, Bld: 92 mg/dL (ref 65–99)
Potassium: 4.5 mmol/L (ref 3.5–5.3)
Sodium: 140 mmol/L (ref 135–146)
TOTAL PROTEIN: 6.3 g/dL (ref 6.1–8.1)

## 2015-07-31 LAB — TSH: TSH: 2.415 u[IU]/mL (ref 0.350–4.500)

## 2015-07-31 MED ORDER — LOSARTAN POTASSIUM 50 MG PO TABS: 50.0000 mg | ORAL_TABLET | Freq: Every day | ORAL | Status: DC

## 2015-07-31 MED ORDER — CYCLOBENZAPRINE HCL 5 MG PO TABS: ORAL_TABLET | ORAL | Status: DC

## 2015-07-31 NOTE — Progress Notes (Signed)
   Subjective:    Patient ID: Christopher Bolton, male    DOB: February 05, 1961, 54 y.o.   MRN: [id number]  HPI    Review of Systems  Constitutional: Negative.   HENT: Negative.   Eyes: Negative.   Respiratory: Negative.   Cardiovascular: Negative.   Gastrointestinal: Negative.   Endocrine: Negative.   Genitourinary: Negative.   Musculoskeletal: Positive for back pain and neck pain.  Skin: Negative.   Allergic/Immunologic: Negative.   Neurological: Negative.   Hematological: Negative.   Psychiatric/Behavioral: Negative.        Objective:   Physical Exam        Assessment & Plan:

## 2015-07-31 NOTE — Progress Notes (Addendum)
Patient ID: Christopher Bolton, male   DOB: 02-05-1961, 54 y.o.   MRN: [id number]    This chart was scribed for Nena Jordan, MD by [facility], medical scribe at [facility].The patient was seen in exam room 22 and the patient's care was started at 8:26 AM.  Chief Complaint:  Chief Complaint  Patient presents with  . Annual Exam   HPI: Christopher Bolton is a 54 y.o. male who reports to [place] today for an annual physical exam.   Hx of diverticulosis diagnosed 04/23/2015, so far doing ok. Diet is very restricted, currently eating a low fiber diet. Recommended follow up in three years. Compliant with medications. Dr. Robina Ade follows his Wellbutrin and adderall. Declines flu shot here, will get this through work.  Past Medical History  Diagnosis Date  . Allergy   . Depression   . Asthma   . Hypertension   . Ureteral stone   . Dysuria-frequency syndrome   . History of nausea     related to stone  . Degenerative joint disease 2012  . Attention deficit disorder   . Diverticulitis   . Diverticulitis of colon 04/23/2015  . Hx of adenomatous colonic polyps 07/03/2015   Past Surgical History  Procedure Laterality Date  . Prk Left   . Cholecystectomy    . Cystoscopy/retrograde/ureteroscopy  10/08/2012    Procedure: CYSTOSCOPY/RETROGRADE/URETEROSCOPY;  Surgeon: [name], MD;  Location: [facility];  Service: Urology;  Laterality: Left;  stent   . Trigger finger release    . Cystoscopy with retrograde pyelogram, ureteroscopy and stent placement  10/27/2012    Procedure: [place], URETEROSCOPY AND STENT PLACEMENT;  Surgeon: [name], MD;  [facility];  Service: Urology;  Laterality: Left;  . Holmium laser application  41/96/2229    Procedure: HOLMIUM LASER APPLICATION;  Surgeon: [name], MD;  Location: [facility];  Service: Urology;  Laterality: Left;  . Cystoscopy w/ ureteral stent removal  10/27/2012     Procedure: CYSTOSCOPY WITH STENT REMOVAL;  Surgeon: [name], MD;  Location: [facility];  Service: Urology;  Laterality: Left;  . Cervical fusion  2014   Social History   Social History  . Marital Status: Married    Spouse Name: N/A  . Number of Children: N/A  . Years of Education: N/A   Occupational History  . system analyst [employer]   Social History Main Topics  . Smoking status: Never Smoker   . Smokeless tobacco: Never Used  . Alcohol Use: No     Comment: occasionally - 1-3 beers per week  . Drug Use: No  . Sexual Activity: Yes     Comment: number of sex partners in the last 10 months 1   Other Topics Concern  . 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 Diabetes Mother   . Hypertension Father   . Hypertension Maternal Grandmother   . Hypertension Maternal Grandfather   . Alzheimer's disease Maternal Grandfather   . Alzheimer's disease Paternal Grandmother   . Hypertension Paternal Grandmother   . Diabetes Paternal Grandfather   . Liver cancer Paternal Grandfather   . Colon cancer Neg Hx    Allergies  Allergen Reactions  . Ciprofloxacin Rash   Prior to Admission  medications   Medication Sig Start Date End Date Taking? Authorizing [employer]  amphetamine-dextroamphetamine (ADDERALL XR) 10 MG 24 hr capsule Take 10 mg by mouth every morning. Pt is on a  Staggered dosage.  Taking 5 tablets beginning on Monday, tapering down 1 tablet daily   Yes Historical Lillyn Wieczorek, MD  buPROPion (WELLBUTRIN XL) 150 MG 24 hr tablet Take 450 mg by mouth daily.   Yes Historical Janetta Vandoren, MD  cyclobenzaprine (FLEXERIL) 5 MG tablet TK 1 T PO HS 05/26/15  Yes Historical Makynlee Kressin, MD  levocetirizine (XYZAL) 5 MG tablet Take 1 tablet (5 mg total) by mouth every evening. 01/23/15  Yes Darlyne Russian, MD  losartan (COZAAR) 50 MG tablet Take 1 tablet (50 mg total) by mouth daily. 01/23/15  Yes Darlyne Russian, MD  montelukast (SINGULAIR) 10 MG tablet Take 10 mg by mouth as needed.    Yes Historical Mylia Pondexter, MD  polyethylene glycol (MIRALAX / GLYCOLAX) packet Take 17 g by mouth daily.   Yes Historical Yaziel Brandon, MD  sildenafil (REVATIO) 20 MG tablet Take 2-3 tablets one hour prior to intercourse. 01/23/15  Yes Darlyne Russian, MD  albuterol (PROVENTIL HFA;VENTOLIN HFA) 108 (90 BASE) MCG/ACT inhaler Inhale 2 puffs into the lungs every 4 (four) hours as needed. 01/26/12 05/09/15  Darlyne Russian, MD   ROS: The patient denies fevers, chills, night sweats, unintentional weight loss, chest pain, palpitations, wheezing, dyspnea on exertion, nausea, vomiting, abdominal pain, dysuria, hematuria, melena, numbness, weakness, or tingling.  All other systems have been reviewed and were otherwise negative with the exception of those mentioned in the HPI and as above.    PHYSICAL EXAM: Filed Vitals:   07/31/15 0812  BP: 112/74  Pulse: 73  Temp: 98.4 F (36.9 C)  Resp: 16   Body mass index is 31.46 kg/(m^2).  General: Alert, no acute distress HEENT:  Normocephalic, atraumatic, oropharynx patent. Eye: Juliette Mangle [facility] Cardiovascular:  Regular rate and rhythm, no rubs murmurs or gallops.  No Carotid bruits, radial pulse intact. No pedal edema.  Respiratory: Clear to auscultation bilaterally.  No wheezes, rales, or rhonchi.  No cyanosis, no use of accessory musculature Abdominal: No organomegaly, abdomen is soft, positive bowel sounds.  No masses. Tenderness to deep palpation of the LLQ. Musculoskeletal: Gait intact. No edema, tenderness Skin: No rashes. Neurologic: Facial musculature symmetric. Psychiatric: Patient acts appropriately throughout our interaction. Lymphatic: No cervical or submandibular lymphadenopathy Genitourinary/Anorectal: No acute findings  LABS: Results for orders placed or performed  in visit on 05/17/15  Sedimentation Rate  Result Value Ref Range   Sed Rate 7 0 - 20 mm/hr  CBC  Result Value Ref Range   WBC 4.6 4.0 - 10.5 K/uL   RBC 4.94 4.22 - 5.81 MIL/uL   Hemoglobin 15.0 13.0 - 17.0 g/dL   HCT 42.3 39.0 - 52.0 %   MCV 85.6 78.0 - 100.0 fL   MCH 30.4 26.0 - 34.0 pg   MCHC 35.5 30.0 - 36.0 g/dL   RDW 12.5 11.5 - 15.5 %   Platelets 299 150 - 400 K/uL   MPV 10.0 8.6 - 12.4 fL   EKG/XRAY:   Primary read interpreted by [name] at [facility]. EKG normal sinus rhythm T-wave inverted in aVL.  ASSESSMENT/PLAN: 1. Annual physical exam Patient could stand to lose some weight and exercise more he understands this.  - CBC with Differential/Platelet - COMPLETE METABOLIC PANEL WITH GFR - PSA - POCT urinalysis dipstick - POCT Microscopic Urinalysis ([facility]) - TSH -  EKG 12-Lead  2. Diverticulitis of colon He continues to have some mild soreness in his left lower abdomen. He has been evaluated by GI. He will transition over to a high fiber diet.   3. Essential hypertension Blood pressure at goal  - losartan (COZAAR) 50 MG tablet; Take 1 tablet (50 mg total) by mouth daily.  Dispense: 90 tablet; Refill: 3  4. Other male erectile dysfunction Continued ED drugs   5. Lipid screening  - Lipid panel  6. Immunization due  - Tdap vaccine greater than or equal to 7yo IM  7. Neck pain He takes when necessary Flexeril for this. He has had neck surgery in the past. I suggested he consider having a sleep study. He does not want to schedule this at the present time.- cyclobenzaprine (FLEXERIL) 5 MG tablet; Take one QHS  Dispense: 30 tablet; Refill: 3  8. Chronic fatigue  - Testosterone, Free, Total, SHBG; Future   I personally performed the services described in this documentation, which was scribed in my presence. The recorded information has been reviewed and is accurate.  [name], MD  [facility]  07/31/2015 1:32  PM Gross sideeffects, risk and benefits, and alternatives of medications d/w patient. Patient is aware that all medications have potential sideeffects and we are unable to predict every sideeffect or drug-drug interaction that may occur.  By signing my name below, I, Nadim Abuhashem, attest that this documentation has been prepared under the direction and in the presence of Nena Jordan, MD.  Electronically Signed: Lora Havens, medical scribe. 07/31/2015 8:26 AM.  [name] MD 07/31/2015 8:18 AM

## 2015-08-01 LAB — PSA: PSA: 3.15 ng/mL (ref <=4.00)

## 2015-08-04 LAB — TESTOSTERONE, FREE, TOTAL, SHBG
Sex Hormone Binding: 34 nmol/L (ref 10–50)
TESTOSTERONE: 280 ng/dL — AB (ref 300–890)
Testosterone, Free: 53.7 pg/mL (ref 47.0–244.0)
Testosterone-% Free: 1.9 % (ref 1.6–2.9)

## 2015-08-06 ENCOUNTER — Encounter: Payer: Self-pay | Admitting: Emergency Medicine

## 2015-08-12 ENCOUNTER — Encounter: Payer: Self-pay | Admitting: Emergency Medicine

## 2015-09-11 ENCOUNTER — Encounter: Payer: Self-pay | Admitting: Emergency Medicine

## 2016-01-22 ENCOUNTER — Encounter: Payer: Self-pay | Admitting: Emergency Medicine

## 2016-01-22 ENCOUNTER — Ambulatory Visit (INDEPENDENT_AMBULATORY_CARE_PROVIDER_SITE_OTHER): Payer: 59 | Admitting: Emergency Medicine

## 2016-01-22 VITALS — BP 116/80 | HR 71 | Temp 98.3°F | Resp 16 | Ht 68.25 in | Wt 210.8 lb

## 2016-01-22 DIAGNOSIS — R972 Elevated prostate specific antigen [PSA]: Secondary | ICD-10-CM

## 2016-01-22 DIAGNOSIS — Z13220 Encounter for screening for lipoid disorders: Secondary | ICD-10-CM | POA: Diagnosis not present

## 2016-01-22 DIAGNOSIS — R5382 Chronic fatigue, unspecified: Secondary | ICD-10-CM | POA: Diagnosis not present

## 2016-01-22 DIAGNOSIS — M659 Synovitis and tenosynovitis, unspecified: Secondary | ICD-10-CM | POA: Diagnosis not present

## 2016-01-22 DIAGNOSIS — E349 Endocrine disorder, unspecified: Secondary | ICD-10-CM

## 2016-01-22 DIAGNOSIS — N528 Other male erectile dysfunction: Secondary | ICD-10-CM | POA: Diagnosis not present

## 2016-01-22 DIAGNOSIS — E291 Testicular hypofunction: Secondary | ICD-10-CM

## 2016-01-22 DIAGNOSIS — I10 Essential (primary) hypertension: Secondary | ICD-10-CM | POA: Diagnosis not present

## 2016-01-22 DIAGNOSIS — Z9109 Other allergy status, other than to drugs and biological substances: Secondary | ICD-10-CM

## 2016-01-22 DIAGNOSIS — K5732 Diverticulitis of large intestine without perforation or abscess without bleeding: Secondary | ICD-10-CM | POA: Diagnosis not present

## 2016-01-22 DIAGNOSIS — Z91048 Other nonmedicinal substance allergy status: Secondary | ICD-10-CM | POA: Diagnosis not present

## 2016-01-22 LAB — BASIC METABOLIC PANEL WITH GFR
BUN: 14 mg/dL (ref 7–25)
CALCIUM: 9.2 mg/dL (ref 8.6–10.3)
CO2: 24 mmol/L (ref 20–31)
Chloride: 106 mmol/L (ref 98–110)
Creat: 1.07 mg/dL (ref 0.70–1.33)
GFR, EST NON AFRICAN AMERICAN: 78 mL/min (ref >=60)
GLUCOSE: 87 mg/dL (ref 65–99)
Potassium: 4.3 mmol/L (ref 3.5–5.3)
SODIUM: 139 mmol/L (ref 135–146)

## 2016-01-22 LAB — LIPID PANEL
Cholesterol: 166 mg/dL (ref 125–200)
HDL: 42 mg/dL (ref >=40)
LDL Cholesterol: 106 mg/dL (ref <130)
Total CHOL/HDL Ratio: 4 Ratio (ref <=5.0)
Triglycerides: 92 mg/dL (ref <150)
VLDL: 18 mg/dL (ref <30)

## 2016-01-22 MED ORDER — SILDENAFIL CITRATE 20 MG PO TABS: ORAL_TABLET | ORAL | Status: DC

## 2016-01-22 MED ORDER — LOSARTAN POTASSIUM 50 MG PO TABS: 50.0000 mg | ORAL_TABLET | Freq: Every day | ORAL | Status: DC

## 2016-01-22 MED ORDER — MONTELUKAST SODIUM 10 MG PO TABS: 10.0000 mg | ORAL_TABLET | ORAL | Status: DC | PRN

## 2016-01-22 MED ORDER — LEVOCETIRIZINE DIHYDROCHLORIDE 5 MG PO TABS: 5.0000 mg | ORAL_TABLET | Freq: Every evening | ORAL | Status: DC

## 2016-01-22 MED ORDER — METHYLPREDNISOLONE ACETATE 80 MG/ML IJ SUSP
80.0000 mg | Freq: Once | INTRAMUSCULAR | Status: AC
  Administered 2016-01-22: 80 mg via INTRAMUSCULAR

## 2016-01-22 NOTE — Addendum Note (Signed)
Addended by: Lanna Poche R on: 01/22/2016 03:56 PM   Modules accepted: Miquel Dunn

## 2016-01-22 NOTE — Progress Notes (Signed)
Patient ID: Forman Parekh, male   DOB: [birth year], 55 y.o.   MRN: [id number]   By signing my name below, I, [name], attest that this documentation has been prepared under the direction and in the presence of Darlyne Russian, MD Electronically Signed: Ladene Artist, ED Scribe 01/22/2016 at 8:55 AM.  Chief Complaint:  Chief Complaint  Patient presents with  . Follow-up    6 mos  . Hypertension  . Medication Refill   HPI: Recardo Viereck is a 55 y.o. male who reports to [facility] today for a 6 month follow-up.   L Hand Pt states that he has been having issues with the tendons in his left hand. He reports trigger finger sensations in the 4th and 5th fingers on his left hand.    Allergies Pt is also requesting an injection of DepoMedrol that he receives every Spring for environmental allergies.   Hormones Pt's testosterone was borderline low at 280 when he was last seen. He has not started any hormonal or herbal treatments.   Fatigue Pt is interested in preceding with a sleep study and would like to have an appointment scheduled for chronic fatigue.   GI  Pt is followed by Silvano Rusk, MD for diverticulitis.   Medication Refill Pt requests a refill of Xyzal and Singulair during this visit.   Past Medical History  Diagnosis Date  . Allergy   . Depression   . Asthma   . Hypertension   . Ureteral stone   . Dysuria-frequency syndrome   . History of nausea     related to stone  . Degenerative joint disease 2012  . Attention deficit disorder   . Diverticulitis   . Diverticulitis of colon 04/23/2015  . Hx of adenomatous colonic polyps 07/03/2015   Past Surgical History  Procedure Laterality Date  . Prk Left   . Cholecystectomy    . Cystoscopy/retrograde/ureteroscopy  10/08/2012    Procedure: CYSTOSCOPY/RETROGRADE/URETEROSCOPY;  Surgeon: [name], MD;  Location: [facility];  Service: Urology;  Laterality: Left;  stent   . Trigger finger release    . Cystoscopy with  retrograde pyelogram, ureteroscopy and stent placement  10/27/2012    Procedure: [place], URETEROSCOPY AND STENT PLACEMENT;  Surgeon: [name], MD;  Location: [facility];  Service: Urology;  Laterality: Left;  . Holmium laser application  [id number]    Procedure: HOLMIUM LASER APPLICATION;  Surgeon: [name], MD;  Location: [facility];  Service: Urology;  Laterality: Left;  . Cystoscopy w/ ureteral stent removal  10/27/2012    Procedure: CYSTOSCOPY WITH STENT REMOVAL;  Surgeon: [name], MD;  Location[facility];  Service: Urology;  Laterality: Left;  . Cervical fusion  2014   Social History   Social History  . Marital Status: Married    Spouse Name: N/A  . Number of Children: N/A  . Years of Education: N/A   Occupational History  . system analyst [employer]   Social History Main Topics  . Smoking status: Never Smoker   . Smokeless tobacco: Never Used  . Alcohol Use: No     Comment: occasionally - 1-3 beers per week  . Drug Use: No  . Sexual Activity: Yes     Comment: number of sex partners in the last 33 months 1   Other Topics Concern  . None   Social History Narrative   He is married without children and works as a Environmental education officer for [employer]  [employer]   2 caffeinated beverages daily   Family History  Problem Relation Age of Onset  . Diabetes Mother   . Hypertension Father   . Hypertension Maternal Grandmother   . Hypertension Maternal Grandfather   . Alzheimer's disease Maternal Grandfather   . Alzheimer's disease Paternal Grandmother   . Hypertension Paternal Grandmother   . Diabetes Paternal Grandfather   . Liver cancer Paternal Grandfather   . Colon cancer Neg Hx    Allergies  Allergen Reactions  . Ciprofloxacin Rash   Prior to Admission medications   Medication Sig Start Date End Date Taking? Authorizing Provider  albuterol (PROVENTIL  HFA;VENTOLIN HFA) 108 (90 BASE) MCG/ACT inhaler Inhale 2 puffs into the lungs every 4 (four) hours as needed. 01/26/12 05/09/15  Darlyne Russian, MD  amphetamine-dextroamphetamine (ADDERALL XR) 10 MG 24 hr capsule Take 10 mg by mouth every morning. Pt is on a  Staggered dosage.  Taking 5 tablets beginning on Monday, tapering down 1 tablet daily    Historical Provider, MD  buPROPion (WELLBUTRIN XL) 150 MG 24 hr tablet Take 450 mg by mouth daily.    Historical Provider, MD  cyclobenzaprine (FLEXERIL) 5 MG tablet Take one QHS 07/31/15   Darlyne Russian, MD  levocetirizine (XYZAL) 5 MG tablet Take 1 tablet (5 mg total) by mouth every evening. 01/23/15   Darlyne Russian, MD  losartan (COZAAR) 50 MG tablet Take 1 tablet (50 mg total) by mouth daily. 07/31/15   Darlyne Russian, MD  montelukast (SINGULAIR) 10 MG tablet Take 10 mg by mouth as needed.     Historical Provider, MD  polyethylene glycol (MIRALAX / GLYCOLAX) packet Take 17 g by mouth daily.    Historical Provider, MD  sildenafil (REVATIO) 20 MG tablet Take 2-3 tablets one hour prior to intercourse. 01/23/15   Darlyne Russian, MD   ROS: The patient denies fevers, chills, night sweats, unintentional weight loss, chest pain, palpitations, wheezing, dyspnea on exertion, nausea, vomiting, abdominal pain, dysuria, hematuria, melena, numbness, weakness, or tingling. +fatigue, +arthralgias, +environmental allergies   All other systems have been reviewed and were otherwise negative with the exception of those mentioned in the HPI and as above.    PHYSICAL EXAM: Filed Vitals:   01/22/16 0812  BP: 116/80  Pulse: 71  Temp: 98.3 F (36.8 C)  Resp: 16   Body mass index is 31.8 kg/(m^2).  General: Alert, no acute distress HEENT:  Normocephalic, atraumatic, oropharynx patent. Eye: Juliette Mangle [facility] Cardiovascular: Regular rate and rhythm, no rubs murmurs or gallops. No Carotid bruits, radial pulse intact. No pedal edema.  Respiratory: Clear to auscultation bilaterally. No  wheezes, rales, or rhonchi. No cyanosis, no use of accessory musculature Abdominal: No organomegaly, abdomen is soft and non-tender, positive bowel sounds. No masses. Musculoskeletal: Gait intact. No edema, tenderness GU: Minimally enlarged prostate.  Skin: No rashes. Neurologic: Facial musculature symmetric. Psychiatric: Patient acts appropriately throughout our interaction. Lymphatic: No cervical or submandibular lymphadenopathy  LABS:  EKG/XRAY:   Primary read interpreted by [name] at [facility].  ASSESSMENT/PLAN: Patient will be scheduled for sleep study. He does want to go ahead and see the hand surgeon regarding the difficulty he is having with his flexor tendons left a.m. I did repeat his testosterone level and PSA. His PSA had risen on the last check. If continuing to rise we'll get him over to the urologist. Other meds were refilled. He was given 80 of Depo-Medrol for his allergy symptoms.I personally performed the  services described in this documentation, which was scribed in my presence. The recorded information has been reviewed and is accurate.    Gross sideeffects, risk and benefits, and alternatives of medications d/w patient. Patient is aware that all medications have potential sideeffects and we are unable to predict every sideeffect or drug-drug interaction that may occur.  [name] MD 01/22/2016 8:22 AM

## 2016-01-22 NOTE — Addendum Note (Signed)
Addended by: Lanna Poche R on: 01/22/2016 03:57 PM   Modules accepted: Miquel Dunn

## 2016-01-23 ENCOUNTER — Other Ambulatory Visit: Payer: Self-pay | Admitting: Emergency Medicine

## 2016-01-23 DIAGNOSIS — R972 Elevated prostate specific antigen [PSA]: Secondary | ICD-10-CM

## 2016-01-23 LAB — PSA: PSA: 3.77 ng/mL (ref <=4.00)

## 2016-01-28 ENCOUNTER — Encounter: Payer: Self-pay | Admitting: Emergency Medicine

## 2016-02-04 ENCOUNTER — Ambulatory Visit (INDEPENDENT_AMBULATORY_CARE_PROVIDER_SITE_OTHER): Payer: 59 | Admitting: Neurology

## 2016-02-04 ENCOUNTER — Encounter: Payer: Self-pay | Admitting: Neurology

## 2016-02-04 VITALS — BP 138/92 | HR 68 | Resp 20 | Ht 69.0 in | Wt 214.0 lb

## 2016-02-04 DIAGNOSIS — J301 Allergic rhinitis due to pollen: Secondary | ICD-10-CM | POA: Diagnosis not present

## 2016-02-04 DIAGNOSIS — G4700 Insomnia, unspecified: Secondary | ICD-10-CM | POA: Diagnosis not present

## 2016-02-04 DIAGNOSIS — F5104 Psychophysiologic insomnia: Secondary | ICD-10-CM

## 2016-02-04 NOTE — Patient Instructions (Signed)

## 2016-02-04 NOTE — Progress Notes (Signed)
SLEEP MEDICINE CLINIC   Provider:  Larey Seat, M D  Referring Provider: Darlyne Russian, MD Primary Care Physician:  Jenny Reichmann, MD  Chief Complaint  Patient presents with  . New Patient (Initial Visit)    only gets 4-6 hours of sleep, never had a sleep study, rm 11, alone    HPI:  Christopher Bolton is a 55 y.o. male , seen here as a referral  from [name] for a sleep consultation,    Christopher Bolton is here today to be seen for sleep consultation, having been an insomniac for many years perhaps decades. He is taking Ambien which has been prescribed by [name], but his wife has noted that he snores when he takes his. She has not noted apnea. He suffers from seasonal allergies which may also force him to mouth breath.  His psychiatrist has also made an adjustment in his extended release Adderall prescription. The patient takes 5 of 10 mg extended release Adderall on Monday, for on Tuesday morning, 3 on Wednesday, 2 on Thursday and one on Friday. This change was done in response to his complaint about insomnia. This seemed to have been a cooperative affect over the week if he continued to take 50 mg of Adderall daily. His sleep pattern has been established over the last 15 years. He was troubled by insomnia in his college years.  Sleep habits are as follows: The patient's usual bedtime is around midnight, but his sleep latency depends on Ambien. Without the medication he will get 4 hours of sleep, with the medication his sleep latency is reduced to half an hour and he will sleep up to 8 hours. He shares his bedroom with his wife, the room is cool and quiet and dark. He does have a sound maker to create white noise. He always sleeps on his right side with a pillow supporting his neck. In 2014 he underwent an anterior cervical spinal fusion and since then has difficulties finding a comfortable sleep position. He is sometimes able to sleep prone but he is also prone to back pain which can wake  up. He usually has one bathroom break at night. Usually is awake before his alarm rings in the mornings which is about 5 AM. He does not exercise. He states at 7:30 AM is a time when he feels his sleepiness all day. Christopher Bolton does not take Adderall when not at work on vacation weekends etc. He works regular hours 8 a.m. 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  Sleep medical history and family sleep history:  There is no knowledge of sleep disorders within his parents, his fatehr has REM sleep BD, sister has RLS, as far as he knows. The anterior fusion for cervical spinal stenosis at the level C 5, 6, 7. Was performed in 2014 and affected his ability to find a comfortable sleep position ever since. He is howeve not prone to shoulder pain which she had prior to the surgery. Social history: Caffeine intake in form of iced sweetened tea, 16 ounces in the morning, seldom soda or coffee intake. Alcohol intake one drink a week, tobacco use in any form; none  Review of Systems: Out of a complete 14 system review, the patient complains of only the following symptoms, and all other reviewed systems are negative. The patient endorsed insomnia, depression, the feeling of not getting enough sleep, allergies, hypogonadism acquired.  Epworth  score  4, Fatigue severity score 23  , depression score n/a ,    Social History   Social History  . Marital Status: Married    Spouse Name: N/A  . Number of Children: N/A  . Years of Education: N/A   Occupational History  . system analyst [employer]   Social History Main Topics  . Smoking status: Never Smoker   . Smokeless tobacco: Never Used  . Alcohol Use: No     Comment: occasionally - 1-3 beers per week  . Drug Use: No  . Sexual Activity: Yes     Comment: number of sex partners in the last 56 months 1   Other Topics Concern  . 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 Diabetes Mother   . Hypertension Father   . Hypertension Maternal Grandmother   . Hypertension Maternal Grandfather   . Alzheimer's disease Maternal Grandfather   . Alzheimer's disease Paternal Grandmother   . Hypertension Paternal Grandmother   . Diabetes Paternal Grandfather   . Liver cancer Paternal Grandfather   . Colon cancer Neg Hx     Past Medical History  Diagnosis Date  . Allergy   . Depression   . Asthma   . Hypertension   . Ureteral stone   . Dysuria-frequency syndrome   . History of nausea     related to stone  . Degenerative joint disease 2012  . Attention deficit disorder   . Diverticulitis   . Diverticulitis of colon 04/23/2015  . Hx of adenomatous colonic polyps 07/03/2015    Past Surgical History  Procedure Laterality Date  . Prk Left   . Cholecystectomy    . Cystoscopy/retrograde/ureteroscopy  10/08/2012    Procedure: CYSTOSCOPY/RETROGRADE/URETEROSCOPY;  Surgeon: [name], MD;  Location: [facility];  Service: Urology;  Laterality: Left;  stent   . Trigger finger release    . Cystoscopy with retrograde pyelogram, ureteroscopy and stent placement  10/27/2012    Procedure: [place], URETEROSCOPY AND STENT PLACEMENT;  Surgeon: [name], MD;  Location: [facility];  Service: Urology;  Laterality: Left;  . Holmium laser application  [id number]    Procedure: HOLMIUM LASER APPLICATION;  Surgeon: [name], MD;  Location: [facility];  Service: Urology;  Laterality: Left;  . Cystoscopy w/ ureteral stent removal  10/27/2012    Procedure: CYSTOSCOPY WITH STENT REMOVAL;  Surgeon: [name], MD;  Location: [facility];  Service: Urology;  Laterality: Left;  . Cervical fusion  2014    Current Outpatient Prescriptions    Medication Sig Dispense Refill  . amphetamine-dextroamphetamine (ADDERALL XR) 10 MG 24 hr capsule Take 10 mg by mouth every morning. Pt is on a  Staggered dosage.  Taking 5 tablets beginning on Monday, tapering down 1 tablet daily    . buPROPion (WELLBUTRIN XL) 150 MG 24 hr tablet Take 450 mg by mouth daily.    . cyclobenzaprine (FLEXERIL) 5 MG tablet Take one QHS (Patient taking differently: at bedtime as needed. Take one QHS) 30 tablet 3  . levocetirizine (XYZAL) 5 MG tablet Take 1 tablet (5 mg total) by mouth every evening. 90 tablet 3  . losartan (COZAAR) 50 MG tablet Take 1 tablet (50 mg total) by mouth daily. [place]  tablet 3  . montelukast (SINGULAIR) 10 MG tablet Take 1 tablet (10 mg total) by mouth as needed. 90 tablet 3  . sildenafil (REVATIO) 20 MG tablet Take 2-3 tablets one hour prior to intercourse. 50 tablet 3   No current facility-administered medications for this visit.    Allergies as of 02/04/2016 - Review Complete 02/04/2016  Allergen Reaction Noted  . Ciprofloxacin Rash 04/30/2015    Vitals: BP 138/92 mmHg  Pulse 68  Resp 20  Ht 5\' 9"  (1.753 m)  Wt 214 lb (97.07 kg)  BMI 31.59 kg/m2 Last Weight:  Wt Readings from Last 1 Encounters:  02/04/16 214 lb (97.07 kg)   [id number] mass index is 31.59 kg/(m^2).     Last Height:   Ht Readings from Last 1 Encounters:  02/04/16 5\' 9"  (1.753 m)    Physical exam:  General: The patient is awake, alert and appears not in acute distress. The patient is well groomed. Head: Normocephalic, atraumatic. Neck is supple. Mallampati  3, the tip rests on the tongue.  neck circumference: 16.5 . Nasal airflow restricted ,TMJ is evident . Retrognathia is seen.  Cardiovascular:  Regular rate and rhythm, without  murmurs or carotid bruit, and without distended neck veins. Respiratory: Lungs are clear to auscultation. Skin:  Without evidence of edema, or rash Trunk: BMI is elevated . The patient's posture is not erect. Neurologic exam : The  patient is awake and alert, oriented to place and time.   Memory subjective  described as intact.  Attention span & concentration ability appears normal.  Speech is fluent,  without  dysarthria, dysphonia or aphasia.  Mood and affect are appropriate.  Cranial nerves: Pupils are equal and briskly reactive to light. Funduscopic exam without evidence of pallor or edema. Left eye exophthalmos.  Extraocular movements  in vertical and horizontal planes intact and without nystagmus. Visual fields by finger perimetry are intact. Hearing to finger rub intact.   Facial sensation intact to fine touch.  Facial motor strength is symmetric and tongue and uvula move midline. Shoulder shrug was symmetrical.   Motor exam:   Normal tone, muscle bulk and symmetric strength in all extremities.  Sensory:  Fine touch, pinprick and vibration were tested in all extremities. Proprioception tested in the upper extremities was normal.  Coordination: Rapid alternating movements in the fingers/hands was normal. Finger-to-nose maneuver  normal without evidence of ataxia, dysmetria or tremor.  Gait and station: Patient walks without assistive device and is able unassisted to climb up to the exam table. Strength within normal limits.  Stance is stable and normal.  Deep tendon reflexes: in the  upper and lower extremities are symmetric and intact. Babinski maneuver response is  downgoing.  The patient was advised of the nature of the diagnosed sleep disorder , the treatment options and risks for general a health and wellness arising from not treating the condition.  I spent more than 40 minutes of face to face time with the patient. Greater than 50% of time was spent in counseling and coordination of care. We have discussed the diagnosis and differential and I answered the patient's questions.     Assessment:  After physical and neurologic examination, review of laboratory studies,  Personal review of imaging studies,  reports of other /same  Imaging studies ,  Results of polysomnography/ neurophysiology testing and pre-existing records as far as provided in visit., my assessment is     1) the patient has experienced in the past visit and at  times bizarre dreams while taking gabapentin. That was prior to his next surgery. He occasionally now sleep talks but he is not known to act out dreams he sometimes kicks but he doesn't punch.   2)mild snoring in allergy season  3) insomnia , chronic since college, medication made it sometimes better, sometimes worse. Uses Ambien with good success. No organic reason suspected.     Plan:  Treatment plan and additional workup :  SPLIT study ordered, no morning headaches, dry mouth in allergy season.   Please remember to try to maintain good sleep hygiene, which means:   Keep a regular sleep and wake schedule, try not to exercise or have a meal within 2 hours of your bedtime, try to keep your bedroom conducive for sleep, that is, cool and dark, without light distractors such as an illuminated alarm clock, and refrain from watching TV right before sleep or in the middle of the night and do not keep the TV or radio on during the night. Also, try not to use or play on electronic devices at bedtime, such as your cell phone, tablet PC or laptop. If you like to read at bedtime on an electronic device, try to dim the background light as much as possible. Do not eat in the middle of the night.   We will request a sleep study.    We will look for leg twitching and snoring or sleep apnea.   For chronic insomnia, you are best followed by a psychiatrist and/or sleep psychologist.   We will call you with the sleep study results and make a follow up appointment if needed.        Asencion Partridge Anyia Gierke MD  02/04/2016   CC: Darlyne Russian, [address], [place] [address]

## 2016-02-09 ENCOUNTER — Encounter: Payer: Self-pay | Admitting: *Deleted

## 2016-02-26 ENCOUNTER — Encounter (INDEPENDENT_AMBULATORY_CARE_PROVIDER_SITE_OTHER): Payer: 59 | Admitting: Neurology

## 2016-02-26 DIAGNOSIS — G4710 Hypersomnia, unspecified: Secondary | ICD-10-CM

## 2016-02-26 DIAGNOSIS — J301 Allergic rhinitis due to pollen: Secondary | ICD-10-CM

## 2016-02-26 DIAGNOSIS — F5104 Psychophysiologic insomnia: Secondary | ICD-10-CM

## 2016-03-03 ENCOUNTER — Telehealth: Payer: Self-pay

## 2016-03-03 NOTE — Telephone Encounter (Signed)
I spoke to pt and and advised him that his HST did not reveal any evidence for significant osa and no periods of significant oxygen saturation. There were no abnormal findings. Pt verbalized understanding. Pt states that [name] is changing his medications around to hopefully not make pt an "insomniac". Pt asked that I fax a copy to [name] and [name]. Pt declined a follow up appt with Dr. Brett Fairy at this time. Pt had no questions at this time but was encouraged to call back if questions arise.

## 2016-06-01 ENCOUNTER — Other Ambulatory Visit: Payer: Self-pay

## 2016-06-01 DIAGNOSIS — Z9109 Other allergy status, other than to drugs and biological substances: Secondary | ICD-10-CM

## 2016-06-01 DIAGNOSIS — M542 Cervicalgia: Secondary | ICD-10-CM

## 2016-06-01 MED ORDER — LEVOCETIRIZINE DIHYDROCHLORIDE 5 MG PO TABS: 5.0000 mg | ORAL_TABLET | Freq: Every evening | ORAL | 2 refills | Status: DC

## 2016-06-01 MED ORDER — MONTELUKAST SODIUM 10 MG PO TABS: 10.0000 mg | ORAL_TABLET | ORAL | 2 refills | Status: DC | PRN

## 2016-06-01 MED ORDER — CYCLOBENZAPRINE HCL 5 MG PO TABS: ORAL_TABLET | ORAL | 0 refills | Status: DC

## 2016-06-02 ENCOUNTER — Encounter: Payer: Self-pay | Admitting: Emergency Medicine

## 2016-06-08 DIAGNOSIS — M65342 Trigger finger, left ring finger: Secondary | ICD-10-CM

## 2016-06-08 HISTORY — DX: Trigger finger, left ring finger: M65.342

## 2016-06-11 ENCOUNTER — Other Ambulatory Visit: Payer: Self-pay | Admitting: Orthopedic Surgery

## 2016-06-19 ENCOUNTER — Telehealth: Payer: Self-pay

## 2016-06-19 NOTE — Telephone Encounter (Signed)
Patient requests that a referral is sent to [facility] for Dr Fredna Dow for insurance purposes.  He has surgery scheduled on 07/08/16 for trigger finger.  Please advise, thank you.  CB#: [phone]

## 2016-06-21 NOTE — Telephone Encounter (Signed)
Referrals he has [employer].

## 2016-07-05 ENCOUNTER — Encounter (HOSPITAL_BASED_OUTPATIENT_CLINIC_OR_DEPARTMENT_OTHER): Payer: Self-pay | Admitting: *Deleted

## 2016-07-08 ENCOUNTER — Encounter (HOSPITAL_BASED_OUTPATIENT_CLINIC_OR_DEPARTMENT_OTHER)
Admission: RE | Disposition: A | Discharge status: Home / Self Care | Payer: Self-pay | Source: Ambulatory Visit | Attending: Orthopedic Surgery

## 2016-07-08 ENCOUNTER — Ambulatory Visit (HOSPITAL_BASED_OUTPATIENT_CLINIC_OR_DEPARTMENT_OTHER)
Admission: RE | Admit: 2016-07-08 | Discharge: 2016-07-08 | Disposition: A | Discharge status: Home / Self Care | Payer: 59 | Source: Ambulatory Visit | Attending: Orthopedic Surgery | Admitting: Orthopedic Surgery

## 2016-07-08 ENCOUNTER — Encounter (HOSPITAL_BASED_OUTPATIENT_CLINIC_OR_DEPARTMENT_OTHER): Payer: Self-pay | Admitting: Anesthesiology

## 2016-07-08 ENCOUNTER — Ambulatory Visit (HOSPITAL_BASED_OUTPATIENT_CLINIC_OR_DEPARTMENT_OTHER): Payer: 59 | Admitting: Anesthesiology

## 2016-07-08 DIAGNOSIS — M65342 Trigger finger, left ring finger: Secondary | ICD-10-CM | POA: Diagnosis present

## 2016-07-08 DIAGNOSIS — I10 Essential (primary) hypertension: Secondary | ICD-10-CM | POA: Diagnosis not present

## 2016-07-08 HISTORY — DX: Other seasonal allergic rhinitis: J30.2

## 2016-07-08 HISTORY — DX: Personal history of other diseases of the respiratory system: Z87.09

## 2016-07-08 HISTORY — DX: Stiffness of unspecified joint, not elsewhere classified: M25.60

## 2016-07-08 HISTORY — DX: Trigger finger, left ring finger: M65.342

## 2016-07-08 HISTORY — DX: Personal history of other diseases of the digestive system: Z87.19

## 2016-07-08 HISTORY — DX: Dental restoration status: Z98.811

## 2016-07-08 HISTORY — DX: Personal history of urinary calculi: Z87.442

## 2016-07-08 HISTORY — PX: TRIGGER FINGER RELEASE: SHX641

## 2016-07-08 SURGERY — RELEASE, A1 PULLEY, FOR TRIGGER FINGER
Anesthesia: Monitor Anesthesia Care | Site: Hand | Laterality: Left

## 2016-07-08 MED ORDER — FENTANYL CITRATE (PF) 100 MCG/2ML IJ SOLN: 50.0000 ug | INTRAMUSCULAR | Status: DC | PRN

## 2016-07-08 MED ORDER — SCOPOLAMINE 1 MG/3DAYS TD PT72: 1.0000 | MEDICATED_PATCH | Freq: Once | TRANSDERMAL | Status: DC | PRN

## 2016-07-08 MED ORDER — LIDOCAINE HCL (PF) 0.5 % IJ SOLN
INTRAMUSCULAR | Status: AC
  Filled 2016-07-08: qty 100

## 2016-07-08 MED ORDER — MIDAZOLAM HCL 2 MG/2ML IJ SOLN: 1.0000 mg | INTRAMUSCULAR | Status: DC | PRN

## 2016-07-08 MED ORDER — MIDAZOLAM HCL 5 MG/5ML IJ SOLN
INTRAMUSCULAR | Status: DC | PRN
  Administered 2016-07-08: 2 mg via INTRAVENOUS

## 2016-07-08 MED ORDER — CEFAZOLIN SODIUM-DEXTROSE 2-4 GM/100ML-% IV SOLN
INTRAVENOUS | Status: AC
  Filled 2016-07-08: qty 100

## 2016-07-08 MED ORDER — LIDOCAINE HCL (CARDIAC) 20 MG/ML IV SOLN
INTRAVENOUS | Status: DC | PRN
  Administered 2016-07-08: 30 mg via INTRAVENOUS

## 2016-07-08 MED ORDER — ONDANSETRON HCL 4 MG/2ML IJ SOLN: 4.0000 mg | Freq: Once | INTRAMUSCULAR | Status: DC | PRN

## 2016-07-08 MED ORDER — HYDROCODONE-ACETAMINOPHEN 5-325 MG PO TABS: ORAL_TABLET | ORAL | 0 refills | Status: DC

## 2016-07-08 MED ORDER — CEFAZOLIN SODIUM-DEXTROSE 2-4 GM/100ML-% IV SOLN
2.0000 g | INTRAVENOUS | Status: AC
  Administered 2016-07-08: 2 g via INTRAVENOUS

## 2016-07-08 MED ORDER — OXYCODONE HCL 5 MG PO TABS: 5.0000 mg | ORAL_TABLET | Freq: Once | ORAL | Status: DC | PRN

## 2016-07-08 MED ORDER — HYDROMORPHONE HCL 1 MG/ML IJ SOLN: 0.2500 mg | INTRAMUSCULAR | Status: DC | PRN

## 2016-07-08 MED ORDER — FENTANYL CITRATE (PF) 100 MCG/2ML IJ SOLN
INTRAMUSCULAR | Status: AC
  Filled 2016-07-08: qty 2

## 2016-07-08 MED ORDER — CHLORHEXIDINE GLUCONATE 4 % EX LIQD: 60.0000 mL | Freq: Once | CUTANEOUS | Status: DC

## 2016-07-08 MED ORDER — BUPIVACAINE HCL (PF) 0.25 % IJ SOLN
INTRAMUSCULAR | Status: AC
  Filled 2016-07-08: qty 30

## 2016-07-08 MED ORDER — FENTANYL CITRATE (PF) 100 MCG/2ML IJ SOLN
INTRAMUSCULAR | Status: DC | PRN
  Administered 2016-07-08: 100 ug via INTRAVENOUS

## 2016-07-08 MED ORDER — LACTATED RINGERS IV SOLN
INTRAVENOUS | Status: DC
  Administered 2016-07-08 (×2): via INTRAVENOUS

## 2016-07-08 MED ORDER — PROPOFOL 10 MG/ML IV BOLUS
INTRAVENOUS | Status: DC | PRN
  Administered 2016-07-08 (×2): 20 mg via INTRAVENOUS

## 2016-07-08 MED ORDER — LIDOCAINE HCL (PF) 0.5 % IJ SOLN
INTRAMUSCULAR | Status: DC | PRN
  Administered 2016-07-08: 30 mL via INTRAVENOUS

## 2016-07-08 MED ORDER — DEXAMETHASONE SODIUM PHOSPHATE 10 MG/ML IJ SOLN
INTRAMUSCULAR | Status: AC
  Filled 2016-07-08: qty 1

## 2016-07-08 MED ORDER — MEPERIDINE HCL 25 MG/ML IJ SOLN: 6.2500 mg | INTRAMUSCULAR | Status: DC | PRN

## 2016-07-08 MED ORDER — PROPOFOL 500 MG/50ML IV EMUL
INTRAVENOUS | Status: AC
  Filled 2016-07-08: qty 50

## 2016-07-08 MED ORDER — BUPIVACAINE HCL 0.25 % IJ SOLN
INTRAMUSCULAR | Status: DC | PRN
  Administered 2016-07-08: 7 mL

## 2016-07-08 MED ORDER — LIDOCAINE 2% (20 MG/ML) 5 ML SYRINGE
INTRAMUSCULAR | Status: AC
  Filled 2016-07-08: qty 5

## 2016-07-08 MED ORDER — MIDAZOLAM HCL 2 MG/2ML IJ SOLN
INTRAMUSCULAR | Status: AC
  Filled 2016-07-08: qty 2

## 2016-07-08 MED ORDER — OXYCODONE HCL 5 MG/5ML PO SOLN: 5.0000 mg | Freq: Once | ORAL | Status: DC | PRN

## 2016-07-08 MED ORDER — ONDANSETRON HCL 4 MG/2ML IJ SOLN
INTRAMUSCULAR | Status: AC
  Filled 2016-07-08: qty 2

## 2016-07-08 MED ORDER — LIDOCAINE HCL (PF) 1 % IJ SOLN
INTRAMUSCULAR | Status: AC
  Filled 2016-07-08: qty 30

## 2016-07-08 MED ORDER — GLYCOPYRROLATE 0.2 MG/ML IJ SOLN: 0.2000 mg | Freq: Once | INTRAMUSCULAR | Status: DC | PRN

## 2016-07-08 SURGICAL SUPPLY — 32 items
BANDAGE COBAN STERILE 2 (GAUZE/BANDAGES/DRESSINGS) ×3 IMPLANT
BLADE MINI RND TIP GREEN BEAV (BLADE) IMPLANT
BLADE SURG 15 STRL LF DISP TIS (BLADE) ×2 IMPLANT
BLADE SURG 15 STRL SS (BLADE) ×4
BNDG CONFORM 2 STRL LF (GAUZE/BANDAGES/DRESSINGS) ×3 IMPLANT
BNDG ESMARK 4X9 LF (GAUZE/BANDAGES/DRESSINGS) IMPLANT
CHLORAPREP W/TINT 26ML (MISCELLANEOUS) ×3 IMPLANT
CORDS BIPOLAR (ELECTRODE) ×3 IMPLANT
COVER BACK TABLE 60X90IN (DRAPES) ×3 IMPLANT
COVER MAYO STAND STRL (DRAPES) ×3 IMPLANT
CUFF TOURNIQUET SINGLE 18IN (TOURNIQUET CUFF) ×3 IMPLANT
DRAPE EXTREMITY T 121X128X90 (DRAPE) ×3 IMPLANT
DRAPE SURG 17X23 STRL (DRAPES) ×3 IMPLANT
GAUZE SPONGE 4X4 12PLY STRL (GAUZE/BANDAGES/DRESSINGS) ×3 IMPLANT
GAUZE XEROFORM 1X8 LF (GAUZE/BANDAGES/DRESSINGS) ×3 IMPLANT
GLOVE BIO SURGEON STRL SZ7.5 (GLOVE) ×3 IMPLANT
GLOVE BIOGEL PI IND STRL 8 (GLOVE) ×1 IMPLANT
GLOVE BIOGEL PI INDICATOR 8 (GLOVE) ×2
GOWN STRL REIN XL XLG (GOWN DISPOSABLE) ×3 IMPLANT
GOWN STRL REUS W/ TWL LRG LVL3 (GOWN DISPOSABLE) ×1 IMPLANT
GOWN STRL REUS W/TWL LRG LVL3 (GOWN DISPOSABLE) ×2
NEEDLE HYPO 25X1 1.5 SAFETY (NEEDLE) IMPLANT
NS IRRIG 1000ML POUR BTL (IV SOLUTION) ×3 IMPLANT
PACK BASIN DAY SURGERY FS (CUSTOM PROCEDURE TRAY) ×3 IMPLANT
PADDING CAST ABS 4INX4YD NS (CAST SUPPLIES) ×2
PADDING CAST ABS COTTON 4X4 ST (CAST SUPPLIES) ×1 IMPLANT
STOCKINETTE 4X48 STRL (DRAPES) ×3 IMPLANT
SUT ETHILON 4 0 PS 2 18 (SUTURE) ×3 IMPLANT
SYR BULB 3OZ (MISCELLANEOUS) ×3 IMPLANT
SYR CONTROL 10ML LL (SYRINGE) IMPLANT
TOWEL OR 17X24 6PK STRL BLUE (TOWEL DISPOSABLE) ×6 IMPLANT
UNDERPAD 30X30 (UNDERPADS AND DIAPERS) ×3 IMPLANT

## 2016-07-08 NOTE — Op Note (Signed)
07/08/2016 [facility]  Operative Note  PREOPERATIVE DIAGNOSIS: trigger left ring finger  POSTOPERATIVE DIAGNOSIS:  trigger left ring finger  PROCEDURE: Procedure(s): RELEASE TRIGGER FINGER/A-1 PULLEY left ring finger  SURGEON:  [name], MD  ASSISTANT:  none.  ANESTHESIA:  Bier block and sedation.  IV FLUIDS:  Per anesthesia flow sheet.  ESTIMATED BLOOD LOSS:  Minimal.  COMPLICATIONS:  None.  SPECIMENS:  None.  TOURNIQUET TIME:  Total Tourniquet Time Documented: Forearm (Left) - 18 minutes Total: Forearm (Left) - 18 minutes   DISPOSITION:  Stable to PACU.  LOCATION: [facility]  INDICATIONS: Christopher Bolton is a 55 y.o. male with pain in the left ring finger.  There is a palpable and tender flexor tendon nodule that clicks with compression over the A1 pulley.  Risks, benefits and alternatives of surgery were discussed including the risk of blood loss, infection, damage to nerves, vessels, tendons, ligaments, bone, failure of surgery, need for additional surgery, complications with wound healing, continued pain, continued triggering and need for repeat surgery.  He voiced understanding of these risks and elected to proceed.  OPERATIVE COURSE:  After being identified preoperatively by myself, the patient and I agreed upon the procedure and site of procedure.  The surgical site was marked. The risks, benefits, and alternatives of surgery were reviewed and he wished to proceed.  Surgical consent had been signed. He was given IV Ancef as preoperative antibiotic prophylaxis. He was transported to the operating room and placed on the operating room table in supine position with the Left upper extremity on an arm board. Bier block and sedation was induced by the anesthesiologist.  The Left upper extremity was prepped and draped in normal sterile orthopedic fashion. A surgical pause was performed between surgeons, anesthesia, and operating room staff, and  all were in agreement as to the patient, procedure, and site of procedure.  Tourniquet at the proximal aspect of the forearm had been inflated for the Bier block.  An incision was made at the volar aspect of the MP joint of the ring finger.  This was carried into the subcutaneous tissues by preading technique.  Bipolar electrocautery was used to obtain hemostasis.  The radial and ulnar digital nerves were protected throughout the case. The flexor sheath was identified.  The A1 pulley was identified and sharply incised.  It was released in its entirety.  The proximal 1-2 mm of the A2 pulley was vented to allow better excursion of the tendons.  The finger was placed through a range of motion and there was noted to be no catching.  The tendons were brought through the wound and any adherences released.  The wound was then copiously irrigated with sterile saline. It was closed with 4-0 nylon in a horizontal mattress fashion.  It was injected with 7 mL of 0.25% plain Marcaine to aid in postoperative analgesia.  It was dressed with sterile Xeroform, 4x4s, and wrapped lightly with a Coban dressing.  Tourniquet was deflated at 18 minutes.  The fingertips were pink with brisk capillary refill after deflation of the tourniquet.  The operative drapes were broken down and the patient was awoken from anesthesia safely.  He was transferred back to the stretcher and taken to the PACU in stable condition.   I will see him back in the office in 1 week for postoperative followup.  I will give him a prescription for Norco 5/325 1-2 po q6 hours prn pain dispense #20.

## 2016-07-08 NOTE — Anesthesia Postprocedure Evaluation (Signed)
Anesthesia Post Note  Patient: Christopher Bolton  Procedure(s) Performed: Procedure(s) (LRB): RELEASE TRIGGER FINGER/A-1 PULLEY left ring finger (Left)  Patient location during evaluation: PACU Anesthesia Type: MAC Level of consciousness: awake and alert Pain management: pain level controlled Vital Signs Assessment: post-procedure vital signs reviewed and stable Respiratory status: spontaneous breathing, nonlabored ventilation and respiratory function stable Cardiovascular status: stable and blood pressure returned to baseline Anesthetic complications: no    Last Vitals:  Vitals:   07/08/16 0945 07/08/16 1015  BP: (!) 139/93 (!) 156/93  Pulse: 62 80  Resp: 14 18  Temp:  37.1 C    Last Pain:  Vitals:   07/08/16 1015  TempSrc:   PainSc: 0-No pain                 Makalia Bare A

## 2016-07-08 NOTE — Transfer of Care (Signed)
Immediate Anesthesia Transfer of Care Note  Patient: Christopher Bolton  Procedure(s) Performed: Procedure(s) with comments: RELEASE TRIGGER FINGER/A-1 PULLEY left ring finger (Left) - RELEASE TRIGGER FINGER/A-1 PULLEY left ring finger  Patient Location: PACU  Anesthesia Type:Bier block  Level of Consciousness: awake and patient cooperative  Airway & Oxygen Therapy: Patient Spontanous Breathing and Patient connected to face mask oxygen  Post-op Assessment: Report given to RN and Post -op Vital signs reviewed and stable  Post vital signs: Reviewed and stable  Last Vitals:  Vitals:   07/08/16 0742  BP: (!) 137/95  Pulse: 61  Resp: 18  Temp: 36.8 C    Last Pain:  Vitals:   07/08/16 0742  TempSrc: Oral  PainSc: 4          Complications: No apparent anesthesia complications

## 2016-07-08 NOTE — Anesthesia Preprocedure Evaluation (Signed)
Anesthesia Evaluation  Patient identified by MRN, date of birth, ID band Patient awake    Reviewed: Allergy & Precautions, NPO status , Patient's Chart, lab work & pertinent test results  Airway Mallampati: I  TM Distance: >3 FB Neck ROM: Full    Dental  (+) Teeth Intact, Dental Advisory Given   Pulmonary    breath sounds clear to auscultation       Cardiovascular hypertension, Pt. on medications  Rhythm:Regular Rate:Normal     Neuro/Psych    GI/Hepatic   Endo/Other    Renal/GU      Musculoskeletal   Abdominal   Peds  Hematology   Anesthesia Other Findings   Reproductive/Obstetrics                             Anesthesia Physical Anesthesia Plan  ASA: II  Anesthesia Plan: Bier Block and MAC   Post-op Pain Management:    Induction: Intravenous  Airway Management Planned: Simple Face Mask and LMA  Additional Equipment:   Intra-op Plan:   Post-operative Plan: Extubation in OR  Informed Consent: I have reviewed the patients History and Physical, chart, labs and discussed the procedure including the risks, benefits and alternatives for the proposed anesthesia with the patient or authorized representative who has indicated his/her understanding and acceptance.   Dental advisory given  Plan Discussed with: CRNA, Anesthesiologist and Surgeon  Anesthesia Plan Comments:         Anesthesia Quick Evaluation

## 2016-07-08 NOTE — Brief Op Note (Signed)
07/08/2016  9:10 AM  PATIENT:  Christopher Bolton  55 y.o. male  PRE-OPERATIVE DIAGNOSIS:  trigger left ring finger  POST-OPERATIVE DIAGNOSIS:  trigger left ring finger  PROCEDURE:  Procedure(s) with comments: RELEASE TRIGGER FINGER/A-1 PULLEY left ring finger (Left) - RELEASE TRIGGER FINGER/A-1 PULLEY left ring finger  SURGEON:  Surgeon(s) and Role:    * [name], MD - Primary  PHYSICIAN ASSISTANT:   ASSISTANTS: none   ANESTHESIA:   bier block with sedation  EBL:  Total I/O In: 1000 [I.V.:1000] Out: 3 [Blood:3]  BLOOD ADMINISTERED:none  DRAINS: none   LOCAL MEDICATIONS USED:  MARCAINE     SPECIMEN:  No Specimen  DISPOSITION OF SPECIMEN:  N/A  COUNTS:  YES  TOURNIQUET:   Total Tourniquet Time Documented: Forearm (Left) - 18 minutes Total: Forearm (Left) - 18 minutes   DICTATION: .Note written in EPIC  PLAN OF CARE: Discharge to home after PACU  PATIENT DISPOSITION:  PACU - hemodynamically stable.

## 2016-07-08 NOTE — Discharge Instructions (Addendum)

## 2016-07-08 NOTE — H&P (Signed)
Christopher Bolton is an 55 y.o. male.   Chief Complaint: left ring finger trigger digit HPI: 54 yo male with trigger digit left ring finger.  Causes pain in palm.  He wishes to have a trigger release for management of symptoms.  Allergies:  Allergies  Allergen Reactions  . Apricot Flavor Other (See Comments)    ITCHING OF THROAT  . Cantaloupe (Diagnostic) Other (See Comments)    ALSO HONEYDEW MELON - ITCHING OF THROAT  . Mango Flavor Other (See Comments)    ITCHING OF THROAT  . Other Other (See Comments)    FILBERT'S NUTS - ITCHING OF THROAT  . Peach Flavor Other (See Comments)    ITCHING OF THROAT  . Watermelon Flavor [Flavoring Agent] Other (See Comments)    ITCHING OF THROAT  . Ciprofloxacin Itching    Past Medical History:  Diagnosis Date  . Attention deficit disorder   . Degenerative joint disease 2012  . Dental crowns present   . Depression   . History of asthma    no current med.; is triggered by seasonal allergies  . History of diverticulitis 2016  . History of kidney stones   . Hx of adenomatous colonic polyps 06/2015  . Hypertension    states under control with med.(Intuniv); has been on med. x 3 yr.  . Limited joint range of motion    cervical spine  . Seasonal allergies   . Trigger finger, left ring finger 06/2016    Past Surgical History:  Procedure Laterality Date  . CERVICAL FUSION  2014  . CHOLECYSTECTOMY    . COLONOSCOPY WITH PROPOFOL  06/30/2015  . CYSTOSCOPY W/ URETERAL STENT REMOVAL  10/27/2012   Procedure: CYSTOSCOPY WITH STENT REMOVAL;  Surgeon: Christopher Frock, MD;  Location: [facility];  Service: Urology;  Laterality: Left;  . CYSTOSCOPY WITH RETROGRADE PYELOGRAM, URETEROSCOPY AND STENT PLACEMENT  10/27/2012   Procedure: CYSTOSCOPY WITH RETROGRADE PYELOGRAM, URETEROSCOPY AND STENT PLACEMENT;  Surgeon: Christopher Frock, MD;  Location: [facility];  Service: Urology;  Laterality: Left;  .  CYSTOSCOPY/RETROGRADE/URETEROSCOPY  10/08/2012   Procedure: CYSTOSCOPY/RETROGRADE/URETEROSCOPY;  Surgeon: Christopher Frock, MD;  Location: [facility];  Service: Urology;  Laterality: Left;  stent   . HOLMIUM LASER APPLICATION  [id number]   Procedure: HOLMIUM LASER APPLICATION;  Surgeon: Christopher Frock, MD;  Location: [facility];  Service: Urology;  Laterality: Left;  . PHOTOREFRACTIVE KERATOTOMY Left   . TRIGGER FINGER RELEASE Right 02/22/2011   long finger    Family History: Family History  Problem Relation Age of Onset  . Diabetes Mother   . Hypertension Father   . Hypertension Maternal Grandmother   . Hypertension Maternal Grandfather   . Alzheimer's disease Maternal Grandfather   . Alzheimer's disease Paternal Grandmother   . Hypertension Paternal Grandmother   . Diabetes Paternal Grandfather   . Liver cancer Paternal Grandfather     Social History:   reports that he has never smoked. He has never used smokeless tobacco. He reports that he does not drink alcohol or use drugs.  Medications: Medications Prior to Admission  Medication Sig Dispense Refill  . amphetamine-dextroamphetamine (ADDERALL) 10 MG tablet Take 50 mg by mouth daily with breakfast.    . buPROPion (WELLBUTRIN XL) 150 MG 24 hr tablet Take 450 mg by mouth daily.    Marland Kitchen guanFACINE (INTUNIV) 1 MG TB24 Take 1 mg by mouth daily.      No results found for this or any previous visit (from the  past 48 hour(s)).  No results found.   A comprehensive review of systems was negative.  Blood pressure (!) 137/95, pulse 61, temperature 98.3 F (36.8 C), temperature source Oral, resp. rate 18, height 5\' 9"  (1.753 m), weight 99.8 kg (220 lb), SpO2 99 %.  General appearance: alert, cooperative and appears stated age Head: Normocephalic, without obvious abnormality, atraumatic Neck: supple, symmetrical, trachea midline Resp: clear to auscultation bilaterally Cardio: regular rate and rhythm GI:  non-tender Extremities: Intact sensation and capillary refill all digits.  +epl/fpl/io.  No wounds.  Pulses: 2+ and symmetric Skin: Skin color, texture, turgor normal. No rashes or lesions Neurologic: Grossly normal Incision/Wound:none  Assessment/Plan Left ring finger trigger digit.  Non operative and operative treatment options were discussed with the patient and patient wishes to proceed with operative treatment. Risks, benefits, and alternatives of surgery were discussed and the patient agrees with the plan of care.   Christopher Bolton 07/08/2016, 8:34 AM

## 2016-07-09 ENCOUNTER — Encounter (HOSPITAL_BASED_OUTPATIENT_CLINIC_OR_DEPARTMENT_OTHER): Payer: Self-pay | Admitting: Orthopedic Surgery

## 2016-09-16 ENCOUNTER — Telehealth: Payer: Self-pay

## 2016-09-16 NOTE — Telephone Encounter (Signed)
Pt is calling saying he came to see you and had blood work done on top of his reason for visit as he became aware of your retirement he was curious if you could recode it as a wellness exam. I made him aware that CPE entail more than just blood work but told him I would still ask you about it He wants this changed so he could receive incentives from his Acupuncturist number [phone]

## 2016-09-17 ENCOUNTER — Encounter: Payer: Self-pay | Admitting: Emergency Medicine

## 2016-09-17 NOTE — Telephone Encounter (Signed)
If the visit was for a complete physical we can coded as a wellness exam. If it was not a complete physical we cannot change the code.

## 2016-10-05 ENCOUNTER — Encounter: Payer: Self-pay | Admitting: Family Medicine

## 2016-10-05 ENCOUNTER — Ambulatory Visit (INDEPENDENT_AMBULATORY_CARE_PROVIDER_SITE_OTHER): Payer: 59 | Admitting: Family Medicine

## 2016-10-05 VITALS — BP 150/92 | HR 80 | Temp 98.5°F | Resp 16 | Ht 68.5 in | Wt 226.4 lb

## 2016-10-05 DIAGNOSIS — Z13220 Encounter for screening for lipoid disorders: Secondary | ICD-10-CM

## 2016-10-05 DIAGNOSIS — I10 Essential (primary) hypertension: Secondary | ICD-10-CM

## 2016-10-05 DIAGNOSIS — Z23 Encounter for immunization: Secondary | ICD-10-CM | POA: Diagnosis not present

## 2016-10-05 DIAGNOSIS — Z0000 Encounter for general adult medical examination without abnormal findings: Secondary | ICD-10-CM | POA: Diagnosis not present

## 2016-10-05 MED ORDER — LOSARTAN POTASSIUM 50 MG PO TABS: 50.0000 mg | ORAL_TABLET | Freq: Every day | ORAL | 1 refills | Status: DC

## 2016-10-05 NOTE — Patient Instructions (Addendum)
Keep a record of your blood pressures outside of the office and if remaining over 140/90 - return to discuss changes in meds.   Call the previous dermatologist you have seen for an appointment to discuss the lesion on the back of your neck. This may be an irritated/scar tissue area, but would like to make sure it does not look like an abnormal skin lesion or early skin cancer. Let me know if you need a referral.  Keep follow-up with urologist for elevated PSA.  Keeping you healthy  Get these tests  Blood pressure- Have your blood pressure checked once a year by your healthcare provider.  Normal blood pressure is 120/80  Weight- Have your body mass index (BMI) calculated to screen for obesity.  BMI is a measure of body fat based on height and weight. You can also calculate your own BMI at ViewBanking.[email or web address].  Cholesterol- Have your cholesterol checked every year.  Diabetes- Have your blood sugar checked regularly if you have high blood pressure, high cholesterol, have a family history of diabetes or if you are overweight.  Screening for Colon Cancer- Colonoscopy starting at age 41.  Screening may begin sooner depending on your family history and other health conditions. Follow up colonoscopy as directed by your Gastroenterologist.  Screening for Prostate Cancer- Both blood work (PSA) and a rectal exam help screen for Prostate Cancer.  Screening begins at age 29 with African-American men and at age 79 with Caucasian men.  Screening may begin sooner depending on your family history.  Take these medicines  Aspirin- One aspirin daily can help prevent Heart disease and Stroke.  Flu shot- Every fall.  Tetanus- Every 10 years.  Zostavax- Once after the age of 42 to prevent Shingles.  Pneumonia shot- Once after the age of 55; if you are younger than 105, ask your healthcare provider if you need a Pneumonia shot.  Take these steps  Don't smoke- If you do smoke, talk to your doctor  about quitting.  For tips on how to quit, go to www.smokefree.gov or call 1-800-QUIT-NOW.  Be physically active- Exercise 5 days a week for at least 30 minutes.  If you are not already physically active start slow and gradually work up to 30 minutes of moderate physical activity.  Examples of moderate activity include walking briskly, mowing the yard, dancing, swimming, bicycling, etc.  Eat a healthy diet- Eat a variety of healthy food such as fruits, vegetables, low fat milk, low fat cheese, yogurt, lean meant, poultry, fish, beans, tofu, etc. For more information go to www.thenutritionsource.org  Drink alcohol in moderation- Limit alcohol intake to less than two drinks a day. Never drink and drive.  Dentist- Brush and floss twice daily; visit your dentist twice a year.  Depression- Your emotional health is as important as your physical health. If you're feeling down, or losing interest in things you would normally enjoy please talk to your healthcare provider.  Eye exam- Visit your eye doctor every year.  Safe sex- If you may be exposed to a sexually transmitted infection, use a condom.  Seat belts- Seat belts can save your life; always wear one.  Smoke/Carbon Monoxide detectors- These detectors need to be installed on the appropriate level of your home.  Replace batteries at least once a year.  Skin cancer- When out in the sun, cover up and use sunscreen 15 SPF or higher.  Violence- If anyone is threatening you, please tell your healthcare provider.  Living Will/ Health care power  of attorney- Speak with your healthcare provider and family.  IF you received an x-ray today, you will receive an invoice from [facility]. Please contact [facility] at [phone] with questions or concerns regarding your invoice.   IF you received labwork today, you will receive an invoice from [employer]. Please contact [facility] at [phone] with questions  or concerns regarding your invoice.   Our billing staff will not be able to assist you with questions regarding bills from these companies.  You will be contacted with the lab results as soon as they are available. The fastest way to get your results is to activate your My Chart account. Instructions are located on the last page of this paperwork. If you have not heard from [place] regarding the results in 2 weeks, please contact this office.

## 2016-10-05 NOTE — Progress Notes (Signed)
By signing my name below, I, Christopher Bolton, attest that this documentation has been prepared under the direction and in the presence of Christopher Ray, MD.  Electronically Signed: Verlee Bolton, Medical Scribe. 10/05/16. 8:53 AM.  Subjective:    Patient ID: Christopher Bolton, male    DOB: Jul 06, 1961, 56 y.o.   MRN: [id number]  HPI Chief Complaint  Patient presents with  . Annual Exam  . Medication Refill    Losartan   HPI Comments: Christopher Bolton is a 55 y.o. male who presents to the [facility] for his annual physical. PMHx of HTN, cervical DDD, allergic rhinitis, low testosterone, and kidney stones. Prev pt of [name]. Pt is not fasting.  HTN: Takes losartan 50mg  QD. Pt was off of losartan prior to July 2017 since he was on guanfacine for ADD. BP at home runs 130-145/70-90. Denies experiencing any negative side effects while on this medication. Now off guanfacine, back on losartan.  Lab Results  Component Value Date   CREATININE 1.07 01/22/2016   BP Readings from Last 3 Encounters:  10/05/16 (!) 150/92  07/08/16 (!) 156/93  02/04/16 (!) 138/92   Fatigue: Referred to sleep study earlier this year. Had a home sleep test in April; AHI 0.9 Mentions he sleeps 4.5-5 hours a night. followed by psychiatry  Cancer Screening: Prostate CA: He had elevated PSA in a biopsy in urology. Prostate biopsy 5/25 was all benign prostate tissue, except for 1 tissue indicating small focus of atypical gland. Waiting for close follow-up with urologist. Last testoerone level done 07/2015 was low at 280. Pt plans on meeting with urology twice a year. Last saw [name] 7/17. Lab Results  Component Value Date   PSA 3.77 01/22/2016   PSA 3.15 07/31/2015   PSA 2.51 04/21/2015  Colon CA: Colonoscopy 06/30/2015; 4 adenomas found, repeat in 3 years. [name] Skin CA: Reports a lump on the back of his head that he's had for the past 3-4 months and had it removed by [name] in  [place]. Mentions he saw a dermatologist a year ago.  Immunizations: Pt would like to get his flu shot today. Immunization History  Administered Date(s) Administered  . Influenza-Unspecified 08/08/2014  . Tdap 07/31/2015   Vision: Is followed by an ophthalmologist and last saw them in April.  Visual Acuity Screening   Right eye Left eye Both eyes  Without correction: 20/15 20/70 20/15   With correction:      Dentist: followed biannually.  Exercise: Does yard work for exercise.No chest pains or dyspnea with exercise.  HIV/Hep C Screening: Neg Hep C screening March 2016, and no HIV antibodies found in '91. Deferred repeat HIV screening.  Depression Screening: Depression screen [facility] 2/9 10/05/2016 01/22/2016 07/31/2015 05/17/2015 05/09/2015  Decreased Interest 0 0 0 0 0  Down, Depressed, Hopeless 0 0 0 0 0  PHQ - 2 Score 0 0 0 0 0   Anxiety/ADD: Followed by psychiatrist, [name] who Rx wellbutrin but he was on guanfacine prior to July 2017 for ADD. Guanfacine lowers his bp as well as cause fatigue. He ended up feeling too fatigued and discontinued it.  Patient Active Problem List   Diagnosis Date Noted  . Testosterone deficiency 01/22/2016  . Rising PSA level 01/22/2016  . Hx of adenomatous colonic polyps 07/03/2015  . Kidney stones 04/21/2015  . DDD (degenerative disc disease), cervical 02/27/2013  . HTN (hypertension) 02/27/2013  . Allergic rhinitis 02/27/2013   Past Medical History:  Diagnosis Date  . Attention deficit disorder   . Degenerative joint disease 2012  . Dental crowns present   . Depression   . History of asthma    no current med.; is triggered by seasonal allergies  . History of diverticulitis 2016  . History of kidney stones   . Hx of adenomatous colonic polyps 06/2015  . Hypertension    states under control with med.(Intuniv); has been on med. x 3 yr.  . Limited joint range of motion    cervical spine  . Seasonal allergies   . Trigger finger, left ring  finger 06/2016   Past Surgical History:  Procedure Laterality Date  . CERVICAL FUSION  2014  . CHOLECYSTECTOMY    . COLONOSCOPY WITH PROPOFOL  06/30/2015  . CYSTOSCOPY W/ URETERAL STENT REMOVAL  10/27/2012   Procedure: CYSTOSCOPY WITH STENT REMOVAL;  Surgeon: [name], MD;  Location: [facility];  Service: Urology;  Laterality: Left;  . CYSTOSCOPY WITH RETROGRADE PYELOGRAM, URETEROSCOPY AND STENT PLACEMENT  10/27/2012   Procedure: CYSTOSCOPY WITH RETROGRADE PYELOGRAM, URETEROSCOPY AND STENT PLACEMENT;  Surgeon: [name], MD;  Location: [facility];  Service: Urology;  Laterality: Left;  . CYSTOSCOPY/RETROGRADE/URETEROSCOPY  10/08/2012   Procedure: CYSTOSCOPY/RETROGRADE/URETEROSCOPY;  Surgeon: [name], MD;  Location: [facility];  Service: Urology;  Laterality: Left;  stent   . HOLMIUM LASER APPLICATION  [id number]   Procedure: HOLMIUM LASER APPLICATION;  Surgeon: [name], MD;  Location: [facility];  Service: Urology;  Laterality: Left;  . PHOTOREFRACTIVE KERATOTOMY Left   . TRIGGER FINGER RELEASE Right 02/22/2011   long finger  . TRIGGER FINGER RELEASE Left 07/08/2016   Procedure: RELEASE TRIGGER FINGER/A-1 PULLEY left ring finger;  Surgeon: [name], MD;  Location: [place];  Service: Orthopedics;  Laterality: Left;  RELEASE TRIGGER FINGER/A-1 PULLEY left ring finger   Allergies  Allergen Reactions  . Apricot Flavor Other (See Comments)    ITCHING OF THROAT  . Cantaloupe (Diagnostic) Other (See Comments)    ALSO HONEYDEW MELON - ITCHING OF THROAT  . Mango Flavor Other (See Comments)    ITCHING OF THROAT  . Other Other (See Comments)    FILBERT'S NUTS - ITCHING OF THROAT  . Peach Flavor Other (See Comments)    ITCHING OF THROAT  . Watermelon Flavor [Flavoring Agent] Other (See Comments)    ITCHING OF THROAT  . Ciprofloxacin Itching   Prior to Admission medications   Medication Sig Start Date  End Date Taking? Authorizing Provider  amphetamine-dextroamphetamine (ADDERALL) 10 MG tablet Take 50 mg by mouth daily with breakfast.    Historical Provider, MD  buPROPion (WELLBUTRIN XL) 150 MG 24 hr tablet Take 450 mg by mouth daily.    Historical Provider, MD  guanFACINE (INTUNIV) 1 MG TB24 Take 1 mg by mouth daily.    Historical Provider, MD  HYDROcodone-acetaminophen [facility]) 5-325 MG tablet 1-2 tabs po q6 hours prn pain 07/08/16   [name], MD   Social History   Social History  . Marital status: Married    Spouse name: N/A  . Number of children: N/A  . Years of education: N/A   Occupational History  . system analyst [employer]   Social History Main Topics  . Smoking status: Never Smoker  . Smokeless tobacco: Never Used  . Alcohol use No     Comment: occasionally  . Drug use: No  . Sexual activity: Yes   Other Topics Concern  .  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   Musculoskeletal: Positive for back pain and neck stiffness.  Allergic/Immunologic: Positive for environmental allergies and food allergies.  Psychiatric/Behavioral: Positive for sleep disturbance.  13 point ROS positive for the above, otherwise negative.  Objective:  Physical Exam  Constitutional: He is oriented to person, place, and time. He appears well-developed and well-nourished.  HENT:  Head: Normocephalic and atraumatic.  Right Ear: External ear normal.  Left Ear: External ear normal.  Mouth/Throat: Oropharynx is clear and moist.  Eyes: Conjunctivae and EOM are normal. Pupils are equal, round, and reactive to light.  Neck: Normal range of motion. Neck supple. No thyromegaly present.  Cardiovascular: Normal rate, regular rhythm, normal heart sounds and intact distal pulses.   Pulmonary/Chest: Effort  normal and breath sounds normal. No respiratory distress. He has no wheezes.  Abdominal: Soft. He exhibits no distension. There is no tenderness.  Musculoskeletal: Normal range of motion. He exhibits no edema or tenderness.  Lymphadenopathy:    He has no cervical adenopathy.  Neurological: He is alert and oriented to person, place, and time. He has normal reflexes.  Skin: Skin is warm and dry.  approx 3-4 mm elevated erythematous lesion on his left occipital scalp/upperneck; faint scale superficially   Psychiatric: He has a normal mood and affect. His behavior is normal.  Vitals reviewed.  Assessment & Plan:   Tasha Bussiere is a 55 y.o. male Annual physical exam  - -anticipatory guidance as below in AVS, screening labs above. Health maintenance items as above in HPI discussed/recommended as applicable.   Essential hypertension - Plan: COMPLETE METABOLIC PANEL WITH GFR, POCT urinalysis dipstick  - Borderline control, monitor at home, if remains over 140/90, return to discuss medications. Continue losartan for now.  Needs flu shot - Plan: Flu Vaccine QUAD 36+ mos IM  Screening for hyperlipidemia - Plan: Lipid panel  - Return for fasting labs  Lesion on posterior neck. May be scar tissue from previous folliculitis or abrasion versus squamous/basal. For this reason advice dermatology eval. He can call previous dermatologist for appointment, or let me know if referral needed.  Meds ordered this encounter  Medications  . losartan (COZAAR) 50 MG tablet    Sig: Take 50 mg by mouth daily.   Patient Instructions   Keep a record of your blood pressures outside of the office and if remaining over 140/90 - return to discuss changes in meds.   Call the previous dermatologist you have seen for an appointment to discuss the lesion on the back of your neck. This may be an irritated/scar tissue area, but would like to make sure it does not look like an abnormal skin lesion or early skin cancer.  Let me know if you need a referral.  Keep follow-up with urologist for elevated PSA.  Keeping you healthy  Get these tests  Blood pressure- Have your blood pressure checked once a year by your healthcare provider.  Normal blood pressure is 120/80  Weight- Have your body mass index (BMI) calculated to screen for obesity.  BMI is a measure of body fat based on height and weight. You can also calculate your own BMI at ViewBanking.si.  Cholesterol- Have your cholesterol checked every year.  Diabetes- Have your blood sugar checked regularly if you have  high blood pressure, high cholesterol, have a family history of diabetes or if you are overweight.  Screening for Colon Cancer- Colonoscopy starting at age 60.  Screening may begin sooner depending on your family history and other health conditions. Follow up colonoscopy as directed by your Gastroenterologist.  Screening for Prostate Cancer- Both blood work (PSA) and a rectal exam help screen for Prostate Cancer.  Screening begins at age 47 with African-American men and at age 50 with Caucasian men.  Screening may begin sooner depending on your family history.  Take these medicines  Aspirin- One aspirin daily can help prevent Heart disease and Stroke.  Flu shot- Every fall.  Tetanus- Every 10 years.  Zostavax- Once after the age of 36 to prevent Shingles.  Pneumonia shot- Once after the age of 44; if you are younger than 56, ask your healthcare provider if you need a Pneumonia shot.  Take these steps  Don't smoke- If you do smoke, talk to your doctor about quitting.  For tips on how to quit, go to www.smokefree.gov or call 1-800-QUIT-NOW.  Be physically active- Exercise 5 days a week for at least 30 minutes.  If you are not already physically active start slow and gradually work up to 30 minutes of moderate physical activity.  Examples of moderate activity include walking briskly, mowing the yard, dancing, swimming, bicycling,  etc.  Eat a healthy diet- Eat a variety of healthy food such as fruits, vegetables, low fat milk, low fat cheese, yogurt, lean meant, poultry, fish, beans, tofu, etc. For more information go to www.thenutritionsource.org  Drink alcohol in moderation- Limit alcohol intake to less than two drinks a day. Never drink and drive.  Dentist- Brush and floss twice daily; visit your dentist twice a year.  Depression- Your emotional health is as important as your physical health. If you're feeling down, or losing interest in things you would normally enjoy please talk to your healthcare provider.  Eye exam- Visit your eye doctor every year.  Safe sex- If you may be exposed to a sexually transmitted infection, use a condom.  Seat belts- Seat belts can save your life; always wear one.  Smoke/Carbon Monoxide detectors- These detectors need to be installed on the appropriate level of your home.  Replace batteries at least once a year.  Skin cancer- When out in the sun, cover up and use sunscreen 15 SPF or higher.  Violence- If anyone is threatening you, please tell your healthcare provider.  Living Will/ Health care power of attorney- Speak with your healthcare provider and family.  IF you received an x-Bolton today, you will receive an invoice from [facility]. Please contact [facility] at [phone] with questions or concerns regarding your invoice.   IF you received labwork today, you will receive an invoice from [employer]. Please contact [facility] at [phone] with questions or concerns regarding your invoice.   Our billing staff will not be able to assist you with questions regarding bills from these companies.  You will be contacted with the lab results as soon as they are available. The fastest way to get your results is to activate your My Chart account. Instructions are located on the last page of this paperwork. If you have not heard from [place]  regarding the results in 2 weeks, please contact this office.        I personally performed the services described in this documentation, which was scribed in my presence. The recorded information has been reviewed  and considered, and addended by me as needed.   Signed,   Christopher Ray, MD [facility].  10/05/16 9:26 AM

## 2016-10-07 ENCOUNTER — Other Ambulatory Visit (INDEPENDENT_AMBULATORY_CARE_PROVIDER_SITE_OTHER): Payer: 59 | Admitting: Family Medicine

## 2016-10-07 DIAGNOSIS — I10 Essential (primary) hypertension: Secondary | ICD-10-CM

## 2016-10-07 LAB — COMPLETE METABOLIC PANEL WITH GFR
ALT: 29 U/L (ref 9–46)
AST: 21 U/L (ref 10–35)
Albumin: 4.3 g/dL (ref 3.6–5.1)
Alkaline Phosphatase: 80 U/L (ref 40–115)
BUN: 14 mg/dL (ref 7–25)
CHLORIDE: 105 mmol/L (ref 98–110)
CO2: 27 mmol/L (ref 20–31)
Calcium: 9.2 mg/dL (ref 8.6–10.3)
Creat: 1.15 mg/dL (ref 0.70–1.33)
GFR, Est African American: 82 mL/min (ref >=60)
GFR, Est Non African American: 71 mL/min (ref >=60)
GLUCOSE: 93 mg/dL (ref 65–99)
POTASSIUM: 4.8 mmol/L (ref 3.5–5.3)
SODIUM: 142 mmol/L (ref 135–146)
Total Bilirubin: 1.1 mg/dL (ref 0.2–1.2)
Total Protein: 6.6 g/dL (ref 6.1–8.1)

## 2016-10-07 LAB — LIPID PANEL
CHOL/HDL RATIO: 4.1 ratio (ref <5.0)
CHOLESTEROL: 169 mg/dL (ref <200)
HDL: 41 mg/dL (ref >40)
LDL Cholesterol: 112 mg/dL — ABNORMAL HIGH (ref <100)
Triglycerides: 78 mg/dL (ref <150)
VLDL: 16 mg/dL (ref <30)

## 2016-10-12 NOTE — Progress Notes (Signed)
Labs only

## 2016-11-10 DIAGNOSIS — L739 Follicular disorder, unspecified: Secondary | ICD-10-CM | POA: Diagnosis not present

## 2016-11-10 DIAGNOSIS — L282 Other prurigo: Secondary | ICD-10-CM | POA: Diagnosis not present

## 2016-11-10 DIAGNOSIS — L309 Dermatitis, unspecified: Secondary | ICD-10-CM | POA: Diagnosis not present

## 2017-01-24 ENCOUNTER — Ambulatory Visit (INDEPENDENT_AMBULATORY_CARE_PROVIDER_SITE_OTHER): Payer: 59 | Admitting: Family Medicine

## 2017-01-24 VITALS — BP 130/88 | HR 99 | Temp 98.3°F | Resp 18 | Ht 68.5 in | Wt 223.8 lb

## 2017-01-24 DIAGNOSIS — Z8709 Personal history of other diseases of the respiratory system: Secondary | ICD-10-CM

## 2017-01-24 DIAGNOSIS — J301 Allergic rhinitis due to pollen: Secondary | ICD-10-CM | POA: Diagnosis not present

## 2017-01-24 MED ORDER — FLUTICASONE PROPIONATE 50 MCG/ACT NA SUSP: 2.0000 | Freq: Every day | NASAL | 6 refills | Status: DC

## 2017-01-24 MED ORDER — MONTELUKAST SODIUM 10 MG PO TABS: 10.0000 mg | ORAL_TABLET | Freq: Every day | ORAL | 1 refills | Status: DC

## 2017-01-24 MED ORDER — METHYLPREDNISOLONE ACETATE 80 MG/ML IJ SUSP
80.0000 mg | Freq: Once | INTRAMUSCULAR | Status: AC
  Administered 2017-01-24: 80 mg via INTRAMUSCULAR

## 2017-01-24 MED ORDER — ALBUTEROL SULFATE HFA 108 (90 BASE) MCG/ACT IN AERS: 1.0000 | INHALATION_SPRAY | Freq: Four times a day (QID) | RESPIRATORY_TRACT | 0 refills | Status: DC | PRN

## 2017-01-24 MED ORDER — LEVOCETIRIZINE DIHYDROCHLORIDE 5 MG PO TABS: 5.0000 mg | ORAL_TABLET | Freq: Every evening | ORAL | 1 refills | Status: DC

## 2017-01-24 NOTE — Patient Instructions (Addendum)
Depo-Medrol injection given today in preparation for allergy season. I do recommend Flonase nasal spray 2 sprays per nostril each day using the technique we discussed to lessen chance of side effects. Xyzal once per day, and Singulair once per day. Allergen avoidance as much as possible with using a mask when you are around pollen.  Return to the clinic or go to the nearest emergency room if any of your symptoms worsen or new symptoms occur.  I would discuss PSA monitoring with urology and determine if it is something they want [place] to order or if you need to follow-up with urology.  Allergic Rhinitis Allergic rhinitis is when the mucous membranes in the nose respond to allergens. Allergens are particles in the air that cause your body to have an allergic reaction. This causes you to release allergic antibodies. Through a chain of events, these eventually cause you to release histamine into the blood stream. Although meant to protect the body, it is this release of histamine that causes your discomfort, such as frequent sneezing, congestion, and an itchy, runny nose. What are the causes? Seasonal allergic rhinitis (hay fever) is caused by pollen allergens that may come from grasses, trees, and weeds. Year-round allergic rhinitis (perennial allergic rhinitis) is caused by allergens such as house dust mites, pet dander, and mold spores. What are the signs or symptoms?  Nasal stuffiness (congestion).  Itchy, runny nose with sneezing and tearing of the eyes. How is this diagnosed? Your health care provider can help you determine the allergen or allergens that trigger your symptoms. If you and your health care provider are unable to determine the allergen, skin or blood testing may be used. Your health care provider will diagnose your condition after taking your health history and performing a physical exam. Your health care provider may assess you for other related conditions, such as asthma, pink eye,  or an ear infection. How is this treated? Allergic rhinitis does not have a cure, but it can be controlled by:  Medicines that block allergy symptoms. These may include allergy shots, nasal sprays, and oral antihistamines.  Avoiding the allergen. Hay fever may often be treated with antihistamines in pill or nasal spray forms. Antihistamines block the effects of histamine. There are over-the-counter medicines that may help with nasal congestion and swelling around the eyes. Check with your health care provider before taking or giving this medicine. If avoiding the allergen or the medicine prescribed do not work, there are many new medicines your health care provider can prescribe. Stronger medicine may be used if initial measures are ineffective. Desensitizing injections can be used if medicine and avoidance does not work. Desensitization is when a patient is given ongoing shots until the body becomes less sensitive to the allergen. Make sure you follow up with your health care provider if problems continue. Follow these instructions at home: It is not possible to completely avoid allergens, but you can reduce your symptoms by taking steps to limit your exposure to them. It helps to know exactly what you are allergic to so that you can avoid your specific triggers. Contact a health care provider if:  You have a fever.  You develop a cough that does not stop easily (persistent).  You have shortness of breath.  You start wheezing.  Symptoms interfere with normal daily activities. This information is not intended to replace advice given to you by your health care provider. Make sure you discuss any questions you have with your health care provider.  Document Released: 07/20/2001 Document Revised: 06/25/2016 Document Reviewed: 07/02/2013 Elsevier Interactive Patient Education  2017 [employer].   IF you received an x-ray today, you will receive an invoice from [facility]. Please  contact [facility] at [phone] with questions or concerns regarding your invoice.   IF you received labwork today, you will receive an invoice from [facility]. Please contact [facility] at [phone] with questions or concerns regarding your invoice.   Our billing staff will not be able to assist you with questions regarding bills from these companies.  You will be contacted with the lab results as soon as they are available. The fastest way to get your results is to activate your My Chart account. Instructions are located on the last page of this paperwork. If you have not heard from [place] regarding the results in 2 weeks, please contact this office.

## 2017-01-24 NOTE — Progress Notes (Signed)
By signing my name below, I, [name], attest that this documentation has been prepared under the direction and in the presence of [name], MD.  Electronically Signed: [name], Medical Scribe. 01/24/17. 5:23 PM.  Subjective:    Patient ID: Christopher Bolton, male    DOB: 12/28/1960, 56 y.o.   MRN: [id number]  HPI Chief Complaint  Patient presents with  . Allergies    getting perpared for allergy season     HPI Comments: Christopher Bolton is a 56 y.o. male with a PMHx of allergic rhinitis who presents to the Primary Care at [facility] complaining of allergies. It appears he's received depo medrol for environmental allergies in the past seen by [name] March 16th 2017  Pt has yearly seasonal allergies with associated sxs of congestion, rhinorrhea, sneezing, itchy and burning eyes that begin mid - late March to late June. Reportedly in the past 15 - 16 years, receiving an injection of depo medrol in mid March along with OTC allergy medications help him get over the hump in June-July. Reports xyzol "seems to work ok", and he's used singular for relief of his sxs. Pt was told his allergies was misdiagnosed as asthma and was given an inhaler. His asthma hasn't hasn't flared recently so he hasn't needed to use it recently. The only time he needs an inhaler is when his allergies are worse. Pt could use nasal sprays before and after his annual allergy season, but using it during his main allergy seasons worsen his sxs. Allergies are triggered by oak. Denies experiencing any negative side effects such as anxiety, or other mental changes while taking his steroid regimen for his allergies.  Patient Active Problem List   Diagnosis Date Noted  . Testosterone deficiency 01/22/2016  . Rising PSA level 01/22/2016  . Hx of adenomatous colonic polyps 07/03/2015  . Kidney stones 04/21/2015  . DDD (degenerative disc disease), cervical 02/27/2013  . HTN (hypertension)  02/27/2013  . Allergic rhinitis 02/27/2013   Past Medical History:  Diagnosis Date  . Attention deficit disorder   . Degenerative joint disease 2012  . Dental crowns present   . Depression   . History of asthma    no current med.; is triggered by seasonal allergies  . History of diverticulitis 2016  . History of kidney stones   . Hx of adenomatous colonic polyps 06/2015  . Hypertension    states under control with med.(Intuniv); has been on med. x 3 yr.  . Limited joint range of motion    cervical spine  . Seasonal allergies   . Trigger finger, left ring finger 06/2016   Past Surgical History:  Procedure Laterality Date  . CERVICAL FUSION  2014  . CHOLECYSTECTOMY    . COLONOSCOPY WITH PROPOFOL  06/30/2015  . CYSTOSCOPY W/ URETERAL STENT REMOVAL  10/27/2012   Procedure: CYSTOSCOPY WITH STENT REMOVAL;  Surgeon: [name], MD;  Location: [facility];  Service: Urology;  Laterality: Left;  . CYSTOSCOPY WITH RETROGRADE PYELOGRAM, URETEROSCOPY AND STENT PLACEMENT  10/27/2012   Procedure: CYSTOSCOPY WITH RETROGRADE PYELOGRAM, URETEROSCOPY AND STENT PLACEMENT;  Surgeon: [name], MD;  Location: [facility];  Service: Urology;  Laterality: Left;  . CYSTOSCOPY/RETROGRADE/URETEROSCOPY  10/08/2012   Procedure: CYSTOSCOPY/RETROGRADE/URETEROSCOPY;  Surgeon: [name], MD;  Location: [facility];  Service: Urology;  Laterality: Left;  stent   . HOLMIUM LASER APPLICATION  31/51/7616   Procedure: HOLMIUM LASER APPLICATION;  Surgeon: [name], MD;  Location: [facility];  Service: Urology;  Laterality: Left;  . PHOTOREFRACTIVE KERATOTOMY Left   . TRIGGER FINGER RELEASE Right 02/22/2011   long finger  . TRIGGER FINGER RELEASE Left 07/08/2016   Procedure: RELEASE TRIGGER FINGER/A-1 PULLEY left ring finger;  Surgeon: [name], MD;  Location: [facility];  Service: Orthopedics;  Laterality: Left;  RELEASE TRIGGER FINGER/A-1  PULLEY left ring finger   Allergies  Allergen Reactions  . Apricot Flavor Other (See Comments)    ITCHING OF THROAT  . Cantaloupe (Diagnostic) Other (See Comments)    ALSO HONEYDEW MELON - ITCHING OF THROAT  . Mango Flavor Other (See Comments)    ITCHING OF THROAT  . Other Other (See Comments)    FILBERT'S NUTS - ITCHING OF THROAT  . Peach Flavor Other (See Comments)    ITCHING OF THROAT  . Watermelon Flavor [Flavoring Agent] Other (See Comments)    ITCHING OF THROAT  . Ciprofloxacin Itching   Prior to Admission medications   Medication Sig Start Date End Date Taking? Authorizing Provider  amphetamine-dextroamphetamine (ADDERALL) 10 MG tablet Take 50 mg by mouth daily with breakfast.    Historical Provider, MD  buPROPion (WELLBUTRIN XL) 150 MG 24 hr tablet Take 450 mg by mouth daily.    Historical Provider, MD  losartan (COZAAR) 50 MG tablet Take 1 tablet (50 mg total) by mouth daily. 10/05/16   Wendie Agreste, MD   Social History   Social History  . Marital status: Married    Spouse name: N/A  . Number of children: N/A  . Years of education: N/A   Occupational History  . system analyst [employer]   Social History Main Topics  . Smoking status: Never Smoker  . Smokeless tobacco: Never Used  . Alcohol use No     Comment: occasionally  . Drug use: No  . Sexual activity: Yes   Other Topics Concern  . 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   Eyes: Negative for pain and itching.  Allergic/Immunologic: Positive for environmental allergies.  Psychiatric/Behavioral: Positive for sleep disturbance (chronic). Negative for agitation, dysphoric mood and hallucinations. The patient is not nervous/anxious and is not hyperactive.     Objective:  Physical Exam    Constitutional: He appears well-developed and well-nourished. No distress.  HENT:  Head: Normocephalic and atraumatic.  Eyes: Conjunctivae are normal.  Neck: Neck supple.  Cardiovascular: Normal rate, regular rhythm and normal heart sounds.  Exam reveals no gallop and no friction rub.   No murmur heard. Pulmonary/Chest: Effort normal and breath sounds normal. No respiratory distress. He has no wheezes. He has no rales.  Neurological: He is alert.  Skin: Skin is warm and dry.  Psychiatric: He has a normal mood and affect. His behavior is normal.  Nursing note and vitals reviewed.   Vitals:   01/24/17 1634  BP: 130/88  Pulse: 99  Resp: 18  Temp: 98.3 F (36.8 C)  TempSrc: Oral  SpO2: 98%  Weight: 223 lb 12.8 oz (101.5 kg)  Height: 5' 8.5" (1.74 m)   Body mass index is 33.53 kg/m. Assessment & Plan:   Edith Lord is a 56 y.o. male Seasonal allergic rhinitis due to pollen, unspecified chronicity - Plan: methylPREDNISolone acetate (DEPO-MEDROL) injection 80  mg, montelukast (SINGULAIR) 10 MG tablet, levocetirizine (XYZAL) 5 MG tablet, fluticasone (FLONASE) 50 MCG/ACT nasal spray  - Allergen avoidance measures discussed.  -typical approach discusssed of nonsedating antihistamine, steroid nasal spray, and Singulair if needed.   - Has required Depo-Medrol in years past to lessen severity of allergies. Agreed to give that again today with potential side effects and risks discussed. Understanding expressed.   History of asthma - Plan: albuterol (PROVENTIL HFA;VENTOLIN HFA) 108 (90 Base) MCG/ACT inhaler, methylPREDNISolone acetate (DEPO-MEDROL) injection 80 mg  - Depo-Medrol given as above. Proventil inhaler given if needed for breakthrough asthma symptoms, but has been well controlled as of late.  Meds ordered this encounter  Medications  . albuterol (PROVENTIL HFA;VENTOLIN HFA) 108 (90 Base) MCG/ACT inhaler    Sig: Inhale 1-2 puffs into the lungs every 6 (six) hours as needed for  wheezing or shortness of breath.    Dispense:  1 Inhaler    Refill:  0  . methylPREDNISolone acetate (DEPO-MEDROL) injection 80 mg  . montelukast (SINGULAIR) 10 MG tablet    Sig: Take 1 tablet (10 mg total) by mouth at bedtime.    Dispense:  90 tablet    Refill:  1  . levocetirizine (XYZAL) 5 MG tablet    Sig: Take 1 tablet (5 mg total) by mouth every evening.    Dispense:  90 tablet    Refill:  1  . fluticasone (FLONASE) 50 MCG/ACT nasal spray    Sig: Place 2 sprays into both nostrils daily.    Dispense:  16 g    Refill:  6   Patient Instructions    Depo-Medrol injection given today in preparation for allergy season. I do recommend Flonase nasal spray 2 sprays per nostril each day using the technique we discussed to lessen chance of side effects. Xyzal once per day, and Singulair once per day. Allergen avoidance as much as possible with using a mask when you are around pollen.  Return to the clinic or go to the nearest emergency room if any of your symptoms worsen or new symptoms occur.  I would discuss PSA monitoring with urology and determine if it is something they want [place] to order or if you need to follow-up with urology.  Allergic Rhinitis Allergic rhinitis is when the mucous membranes in the nose respond to allergens. Allergens are particles in the air that cause your body to have an allergic reaction. This causes you to release allergic antibodies. Through a chain of events, these eventually cause you to release histamine into the blood stream. Although meant to protect the body, it is this release of histamine that causes your discomfort, such as frequent sneezing, congestion, and an itchy, runny nose. What are the causes? Seasonal allergic rhinitis (hay fever) is caused by pollen allergens that may come from grasses, trees, and weeds. Year-round allergic rhinitis (perennial allergic rhinitis) is caused by allergens such as house dust mites, pet dander, and mold spores. What  are the signs or symptoms?  Nasal stuffiness (congestion).  Itchy, runny nose with sneezing and tearing of the eyes. How is this diagnosed? Your health care provider can help you determine the allergen or allergens that trigger your symptoms. If you and your health care provider are unable to determine the allergen, skin or blood testing may be used. Your health care provider will diagnose your condition after taking your health history and performing a physical exam. Your health care provider may assess you for other related conditions, such as  asthma, pink eye, or an ear infection. How is this treated? Allergic rhinitis does not have a cure, but it can be controlled by:  Medicines that block allergy symptoms. These may include allergy shots, nasal sprays, and oral antihistamines.  Avoiding the allergen. Hay fever may often be treated with antihistamines in pill or nasal spray forms. Antihistamines block the effects of histamine. There are over-the-counter medicines that may help with nasal congestion and swelling around the eyes. Check with your health care provider before taking or giving this medicine. If avoiding the allergen or the medicine prescribed do not work, there are many new medicines your health care provider can prescribe. Stronger medicine may be used if initial measures are ineffective. Desensitizing injections can be used if medicine and avoidance does not work. Desensitization is when a patient is given ongoing shots until the body becomes less sensitive to the allergen. Make sure you follow up with your health care provider if problems continue. Follow these instructions at home: It is not possible to completely avoid allergens, but you can reduce your symptoms by taking steps to limit your exposure to them. It helps to know exactly what you are allergic to so that you can avoid your specific triggers. Contact a health care provider if:  You have a fever.  You develop a  cough that does not stop easily (persistent).  You have shortness of breath.  You start wheezing.  Symptoms interfere with normal daily activities. This information is not intended to replace advice given to you by your health care provider. Make sure you discuss any questions you have with your health care provider. Document Released: 07/20/2001 Document Revised: 06/25/2016 Document Reviewed: 07/02/2013 Elsevier Interactive Patient Education  2017 [employer].   IF you received an x-ray today, you will receive an invoice from [facility]. Please contact [facility] at [phone] with questions or concerns regarding your invoice.   IF you received labwork today, you will receive an invoice from [facility]. Please contact [facility] at [phone] with questions or concerns regarding your invoice.   Our billing staff will not be able to assist you with questions regarding bills from these companies.  You will be contacted with the lab results as soon as they are available. The fastest way to get your results is to activate your My Chart account. Instructions are located on the last page of this paperwork. If you have not heard from [place] regarding the results in 2 weeks, please contact this office.       I personally performed the services described in this documentation, which was scribed in my presence. The recorded information has been reviewed and considered for accuracy and completeness, addended by me as needed, and agree with information above.  Signed,   [name], MD [facility].  01/24/17 7:29 PM

## 2017-01-29 ENCOUNTER — Other Ambulatory Visit: Payer: Self-pay

## 2017-01-29 DIAGNOSIS — J301 Allergic rhinitis due to pollen: Secondary | ICD-10-CM

## 2017-01-29 DIAGNOSIS — Z8709 Personal history of other diseases of the respiratory system: Secondary | ICD-10-CM

## 2017-01-29 NOTE — Telephone Encounter (Signed)
Pt needs these rx reprinted, he states he took them to the pharmacy and they "lost " the hard copies, they want to take new rx to different pharmacy because they are up set .

## 2017-01-30 MED ORDER — FLUTICASONE PROPIONATE 50 MCG/ACT NA SUSP: 2.0000 | Freq: Every day | NASAL | 6 refills | Status: DC

## 2017-01-30 MED ORDER — ALBUTEROL SULFATE HFA 108 (90 BASE) MCG/ACT IN AERS: 1.0000 | INHALATION_SPRAY | Freq: Four times a day (QID) | RESPIRATORY_TRACT | 0 refills | Status: AC | PRN

## 2017-01-30 MED ORDER — MONTELUKAST SODIUM 10 MG PO TABS: 10.0000 mg | ORAL_TABLET | Freq: Every day | ORAL | 1 refills | Status: DC

## 2017-01-30 MED ORDER — LEVOCETIRIZINE DIHYDROCHLORIDE 5 MG PO TABS: 5.0000 mg | ORAL_TABLET | Freq: Every evening | ORAL | 1 refills | Status: DC

## 2017-01-30 NOTE — Telephone Encounter (Signed)
Refilled (printed as requested).

## 2017-01-31 NOTE — Telephone Encounter (Signed)
Up front for pick up 

## 2017-02-02 ENCOUNTER — Other Ambulatory Visit: Payer: Self-pay | Admitting: Physician Assistant

## 2017-02-02 ENCOUNTER — Ambulatory Visit (INDEPENDENT_AMBULATORY_CARE_PROVIDER_SITE_OTHER): Payer: 59 | Admitting: Physician Assistant

## 2017-02-02 VITALS — BP 143/84 | HR 114 | Temp 100.8°F | Resp 14 | Ht 68.5 in | Wt 220.0 lb

## 2017-02-02 DIAGNOSIS — J029 Acute pharyngitis, unspecified: Secondary | ICD-10-CM

## 2017-02-02 DIAGNOSIS — J111 Influenza due to unidentified influenza virus with other respiratory manifestations: Secondary | ICD-10-CM

## 2017-02-02 DIAGNOSIS — R52 Pain, unspecified: Secondary | ICD-10-CM | POA: Diagnosis not present

## 2017-02-02 DIAGNOSIS — R682 Dry mouth, unspecified: Secondary | ICD-10-CM

## 2017-02-02 LAB — POCT INFLUENZA A/B
INFLUENZA A, POC: NEGATIVE
Influenza B, POC: POSITIVE — AB

## 2017-02-02 LAB — POCT URINALYSIS DIP (MANUAL ENTRY)
Blood, UA: NEGATIVE
GLUCOSE UA: NEGATIVE
LEUKOCYTES UA: NEGATIVE
NITRITE UA: NEGATIVE
Protein Ur, POC: 30 — AB
Spec Grav, UA: 1.03 (ref 1.030–1.035)
Urobilinogen, UA: 2 — AB (ref ?–2.0)
pH, UA: 6 (ref 5.0–8.0)

## 2017-02-02 LAB — POCT RAPID STREP A (OFFICE): Rapid Strep A Screen: NEGATIVE

## 2017-02-02 LAB — POC MICROSCOPIC URINALYSIS (UMFC): MUCUS RE: ABSENT

## 2017-02-02 MED ORDER — OSELTAMIVIR PHOSPHATE 75 MG PO CAPS: 75.0000 mg | ORAL_CAPSULE | Freq: Two times a day (BID) | ORAL | 0 refills | Status: DC

## 2017-02-02 NOTE — Progress Notes (Signed)
SJ

## 2017-02-02 NOTE — Patient Instructions (Addendum)
You have tested positive for the flu, you are contagious until you are fever free for 24 hours without using tylenol or ibuprofen.Please stay out of work until you are no longer contagious. Two major complications after the flu are pneumonia and sinus infections. Please be aware of this and if you are not any better in 7-10 days or you develop worsening cough or sinus pressure, seek care at our clinic or the ED. Continue to wash your hands and wear a mask daily especially around other people.    Influenza, Adult Influenza ("the flu") is an infection in the lungs, nose, and throat (respiratory tract). It is caused by a virus. The flu causes many common cold symptoms, as well as a high fever and body aches. It can make you feel very sick. The flu spreads easily from person to person (is contagious). Getting a flu shot (influenza vaccination) every year is the best way to prevent the flu. Follow these instructions at home:  Take over-the-counter and prescription medicines only as told by your doctor.  Use a cool mist humidifier to add moisture (humidity) to the air in your home. This can make it easier to breathe.  Rest as needed.  Drink enough fluid to keep your pee (urine) clear or pale yellow.  Cover your mouth and nose when you cough or sneeze.  Wash your hands with soap and water often, especially after you cough or sneeze. If you cannot use soap and water, use hand sanitizer.  Stay home from work or school as told by your doctor. Unless you are visiting your doctor, try to avoid leaving home until your fever has been gone for 24 hours without the use of medicine.  Keep all follow-up visits as told by your doctor. This is important. How is this prevented?  Getting a yearly (annual) flu shot is the best way to avoid getting the flu. You may get the flu shot in late summer, fall, or winter. Ask your doctor when you should get your flu shot.  Wash your hands often or use hand sanitizer  often.  Avoid contact with people who are sick during cold and flu season.  Eat healthy foods.  Drink plenty of fluids.  Get enough sleep.  Exercise regularly. Contact a doctor if:  You get new symptoms.  You have: ? Chest pain. ? Watery poop (diarrhea). ? A fever.  Your cough gets worse.  You start to have more mucus.  You feel sick to your stomach (nauseous).  You throw up (vomit). Get help right away if:  You start to be short of breath or have trouble breathing.  Your skin or nails turn a bluish color.  You have very bad pain or stiffness in your neck.  You get a sudden headache.  You get sudden pain in your face or ear.  You cannot stop throwing up. This information is not intended to replace advice given to you by your health care provider. Make sure you discuss any questions you have with your health care provider. Document Released: 08/03/2008 Document Revised: 04/01/2016 Document Reviewed: 08/19/2015 Elsevier Interactive Patient Education  2017 [employer]    IF you received an x-ray today, you will receive an invoice from [facility]. Please contact [facility] at [phone] with questions or concerns regarding your invoice.   IF you received labwork today, you will receive an invoice from [facility]. Please contact [facility] at [phone] with questions or concerns regarding your invoice.   Our billing   billing staff will not be able to assist you with questions regarding bills from these companies.  You will be contacted with the lab results as soon as they are available. The fastest way to get your results is to activate your My Chart account. Instructions are located on the last page of this paperwork. If you have not heard from [place] regarding the results in 2 weeks, please contact this office.

## 2017-02-02 NOTE — Progress Notes (Signed)
MRN: [id number] DOB: Dec 02, 1960  Subjective:   Christopher Bolton is a 56 y.o. male presenting for chief complaint of Sore Throat (Wants hardcopy of prescription) and Sinusitis .  Reports 5 day history of rhinorrhea, ear fullness, sore throat, dry cough (no hemoptysis) and myalgia, subjective fever, night sweats, chills and nausea. Has tried alka seltzer cold with mild  relief. Denies sinus congestion, sinus pain, wheezing, shortness of breath and chest tightness, vomiting, abdominal pain and diarrhea. Has not had sick contact with anyone but does note that his wife got sick after him. Has history of seasonal allergies, has history of asthma. Patient got flu shot this season. Denies smoking. Denies any other aggravating or relieving factors, no other questions or concerns.  Christopher Bolton has a current medication list which includes the following prescription(s): albuterol, amphetamine-dextroamphetamine, bupropion, fluticasone, levocetirizine, losartan, and montelukast. Also is allergic to apricot flavor; cantaloupe (diagnostic); mango flavor; other; peach flavor; watermelon flavor [flavoring agent]; and ciprofloxacin.  Christopher Bolton  has a past medical history of Attention deficit disorder; Degenerative joint disease (2012); Dental crowns present; Depression; History of asthma; History of diverticulitis (2016); History of kidney stones; adenomatous colonic polyps (06/2015); Hypertension; Limited joint range of motion; Seasonal allergies; and Trigger finger, left ring finger (06/2016). Also  has a past surgical history that includes Cholecystectomy; Cystoscopy/retrograde/ureteroscopy (10/08/2012); Trigger finger release (Right, 02/22/2011); Cystoscopy with retrograde pyelogram, ureteroscopy and stent placement (10/27/2012); Holmium laser application (68/61/6837); Cystoscopy w/ ureteral stent removal (10/27/2012); Cervical fusion (2014); Photorefractive keratotomy (Left); Colonoscopy with propofol (06/30/2015); and  Trigger finger release (Left, 07/08/2016).   Objective:   Vitals: BP (!) 143/84   Pulse (!) 114   Temp (!) 100.8 F (38.2 C) (Oral)   Resp 14   Ht 5' 8.5" (1.74 m)   Wt 220 lb (99.8 kg)   SpO2 96%   BMI 32.96 kg/m   Physical Exam  Constitutional: He is oriented to person, place, and time. He appears well-developed and well-nourished. He appears distressed (lying on exam table, appears uncomfortable).  HENT:  Head: Normocephalic and atraumatic.  Right Ear: External ear and ear canal normal. Tympanic membrane is retracted.  Left Ear: External ear and ear canal normal. Tympanic membrane is retracted.  Nose: Nose normal. Right sinus exhibits no maxillary sinus tenderness and no frontal sinus tenderness. Left sinus exhibits no maxillary sinus tenderness and no frontal sinus tenderness.  Mouth/Throat: Uvula is midline. Mucous membranes are dry. Posterior oropharyngeal erythema present. Tonsils are 1+ on the right. Tonsils are 1+ on the left. No tonsillar exudate.  Eyes: Conjunctivae are normal.  Neck: Normal range of motion.  Cardiovascular: Regular rhythm and normal heart sounds.  Tachycardia present.   Pulmonary/Chest: Effort normal and breath sounds normal.  Lymphadenopathy:       Head (right side): No submental, no submandibular, no tonsillar, no preauricular, no posterior auricular and no occipital adenopathy present.       Head (left side): No submental, no submandibular, no tonsillar, no preauricular, no posterior auricular and no occipital adenopathy present.    He has no cervical adenopathy.       Right: No supraclavicular adenopathy present.       Left: No supraclavicular adenopathy present.  Neurological: He is alert and oriented to person, place, and time.  Skin: Skin is warm and dry.  Psychiatric: He has a normal mood and affect.  Vitals reviewed.  Results for orders placed or performed in visit on 02/02/17 (from the past 24 hour(s))  POCT Influenza A/B  Status:  Abnormal   Collection Time: 02/02/17  1:20 PM  Result Value Ref Range   Influenza A, POC Negative Negative   Influenza B, POC Positive (A) Negative  POCT rapid strep A     Status: None   Collection Time: 02/02/17  1:20 PM  Result Value Ref Range   Rapid Strep A Screen Negative Negative  POCT urinalysis dipstick     Status: Abnormal   Collection Time: 02/02/17  1:21 PM  Result Value Ref Range   Color, UA yellow yellow   Clarity, UA clear clear   Glucose, UA negative negative   Bilirubin, UA small (A) negative   Ketones, POC UA moderate (40) (A) negative   Spec Grav, UA 1.030 1.030 - 1.035   Blood, UA negative negative   pH, UA 6.0 5.0 - 8.0   Protein Ur, POC =30 (A) negative   Urobilinogen, UA 2.0 (A) Negative - 2.0   Nitrite, UA Negative Negative   Leukocytes, UA Negative Negative    Assessment and Plan :  1. Sore throat - POCT rapid strep A  2. Body aches - POCT Influenza A/B  3. Dry mouth - POCT urinalysis dipstick - POCT Microscopic Urinalysis ([facility])  4. Influenza Given educational material on influenza. Pt instructed to return to clinic if symptoms worsen, do not improve in 7-10 days, or as needed - oseltamivir (TAMIFLU) 75 MG capsule; Take 1 capsule (75 mg total) by mouth 2 (two) times daily.  Dispense: 10 capsule; Refill: 0  Tenna Delaine, PA-C  [facility] 02/02/2017 1:22 PM

## 2017-02-02 NOTE — Progress Notes (Signed)
Meds ordered this encounter  Medications  . oseltamivir (TAMIFLU) 75 MG capsule    Sig: Take 1 capsule (75 mg total) by mouth 2 (two) times daily.    Dispense:  10 capsule    Refill:  0    Order Specific Question:   Supervising Provider    Answer:   Wardell Honour [[id number]]

## 2017-02-03 ENCOUNTER — Encounter: Payer: Self-pay | Admitting: Physician Assistant

## 2017-02-03 MED ORDER — BENZONATATE 100 MG PO CAPS: 100.0000 mg | ORAL_CAPSULE | Freq: Three times a day (TID) | ORAL | 0 refills | Status: DC | PRN

## 2017-02-04 ENCOUNTER — Ambulatory Visit (INDEPENDENT_AMBULATORY_CARE_PROVIDER_SITE_OTHER): Payer: 59 | Admitting: Urgent Care

## 2017-02-04 ENCOUNTER — Ambulatory Visit (INDEPENDENT_AMBULATORY_CARE_PROVIDER_SITE_OTHER): Payer: 59

## 2017-02-04 VITALS — BP 126/70 | HR 78 | Temp 98.1°F | Resp 17 | Ht 69.0 in | Wt 219.0 lb

## 2017-02-04 DIAGNOSIS — R059 Cough, unspecified: Secondary | ICD-10-CM

## 2017-02-04 DIAGNOSIS — J029 Acute pharyngitis, unspecified: Secondary | ICD-10-CM | POA: Diagnosis not present

## 2017-02-04 DIAGNOSIS — R0989 Other specified symptoms and signs involving the circulatory and respiratory systems: Secondary | ICD-10-CM | POA: Diagnosis not present

## 2017-02-04 DIAGNOSIS — J101 Influenza due to other identified influenza virus with other respiratory manifestations: Secondary | ICD-10-CM | POA: Diagnosis not present

## 2017-02-04 DIAGNOSIS — R05 Cough: Secondary | ICD-10-CM

## 2017-02-04 LAB — POCT RAPID STREP A (OFFICE): RAPID STREP A SCREEN: NEGATIVE

## 2017-02-04 MED ORDER — HYDROCODONE-HOMATROPINE 5-1.5 MG/5ML PO SYRP: 5.0000 mL | ORAL_SOLUTION | Freq: Every evening | ORAL | 0 refills | Status: DC | PRN

## 2017-02-04 NOTE — Progress Notes (Signed)
MRN: [id number] DOB: 18-Jun-1961  Subjective:   Christopher Bolton is a 56 y.o. male presenting for follow up on influenza. Last OV was 02/02/2017. Patient was started on Tamiflu. Has noticed some improvement but has persistent productive cough, chest congestion/rattling, sore throat. His fevers and headaches have been relieved with Tylenol. Denies chest pain, shob, n/v, abdominal pain. He is not using his allergy medicines right now. I did provide a patient with script for Tessalon via mychart. Patient used this with some relief. Has done well with cough syrup in the past. Denies smoking cigarettes.  Christopher Bolton has a current medication list which includes the following prescription(s): albuterol, amphetamine-dextroamphetamine, benzonatate, bupropion, fluticasone, levocetirizine, losartan, montelukast, and oseltamivir. Also is allergic to apricot flavor; cantaloupe (diagnostic); mango flavor; other; peach flavor; watermelon flavor [flavoring agent]; and ciprofloxacin.  Christopher Bolton  has a past medical history of Attention deficit disorder; Degenerative joint disease (2012); Dental crowns present; Depression; History of asthma; History of diverticulitis (2016); History of kidney stones; adenomatous colonic polyps (06/2015); Hypertension; Limited joint range of motion; Seasonal allergies; and Trigger finger, left ring finger (06/2016). Also  has a past surgical history that includes Cholecystectomy; Cystoscopy/retrograde/ureteroscopy (10/08/2012); Trigger finger release (Right, 02/22/2011); Cystoscopy with retrograde pyelogram, ureteroscopy and stent placement (10/27/2012); Holmium laser application (57/84/6962); Cystoscopy w/ ureteral stent removal (10/27/2012); Cervical fusion (2014); Photorefractive keratotomy (Left); Colonoscopy with propofol (06/30/2015); and Trigger finger release (Left, 07/08/2016).  Objective:   Vitals: BP 126/70 (BP Location: Left Arm, Patient Position: Sitting, Cuff Size: Large)   Pulse 78    Temp 98.1 F (36.7 C) (Oral)   Resp 17   Ht 5\' 9"  (1.753 m)   Wt 219 lb (99.3 kg)   SpO2 96%   BMI 32.34 kg/m   Physical Exam  Constitutional: He is oriented to person, place, and time. He appears well-developed and well-nourished.  HENT:  Mouth/Throat: Oropharynx is clear and moist.  Eyes: Right eye exhibits no discharge. Left eye exhibits no discharge.  Neck: Normal range of motion. Neck supple.  Cardiovascular: Normal rate, regular rhythm and intact distal pulses.  Exam reveals no gallop and no friction rub.   No murmur heard. Pulmonary/Chest: No respiratory distress. He has no wheezes. He has no rales.  Lymphadenopathy:    He has cervical adenopathy (bilateral).  Neurological: He is alert and oriented to person, place, and time.  Skin: Skin is warm and dry.    Results for orders placed or performed in visit on 02/04/17 (from the past 24 hour(s))  POCT rapid strep A     Status: None   Collection Time: 02/04/17  5:21 PM  Result Value Ref Range   Rapid Strep A Screen Negative Negative    Dg Chest 2 View  Result Date: 02/04/2017 CLINICAL DATA:  Cough, chest congestion EXAM: CHEST  2 VIEW COMPARISON:  04/21/2015 FINDINGS: The heart size and mediastinal contours are within normal limits. Both lungs are clear. The visualized skeletal structures are unremarkable. IMPRESSION: No active cardiopulmonary disease. Electronically Signed   By: Kathreen Devoid   On: 02/04/2017 16:58    Assessment and Plan :   1. Influenza B 2. Sore throat 3. Cough 4. Chest congestion - Strep culture is pending. Patient is to continue supportive care. Chest x-ray and physical exam findings are reassuring. Recommended patient restart his allergy medications including Xyzal, Singulair. Recheck in 1 week otherwise if no improvement.  Jaynee Eagles, PA-C [facility] [phone] 02/04/2017 4:45 PM

## 2017-02-04 NOTE — Patient Instructions (Addendum)
Schedule Tylenol 500mg  with ibuprofen 400-600mg  every 6 hours with food for pain, inflammation, body aches, fevers.   Influenza, Adult Influenza, more commonly known as "the flu," is a viral infection that primarily affects the respiratory tract. The respiratory tract includes organs that help you breathe, such as the lungs, nose, and throat. The flu causes many common cold symptoms, as well as a high fever and body aches. The flu spreads easily from person to person (is contagious). Getting a flu shot (influenza vaccination) every year is the best way to prevent influenza. What are the causes? Influenza is caused by a virus. You can catch the virus by:  Breathing in droplets from an infected person's cough or sneeze.  Touching something that was recently contaminated with the virus and then touching your mouth, nose, or eyes. What increases the risk? The following factors may make you more likely to get the flu:  Not cleaning your hands frequently with soap and water or alcohol-based hand sanitizer.  Having close contact with many people during cold and flu season.  Touching your mouth, eyes, or nose without washing or sanitizing your hands first.  Not drinking enough fluids or not eating a healthy diet.  Not getting enough sleep or exercise.  Being under a high amount of stress.  Not getting a yearly (annual) flu shot. You may be at a higher risk of complications from the flu, such as a severe lung infection (pneumonia), if you:  Are over the age of 44.  Are pregnant.  Have a weakened disease-fighting system (immune system). You may have a weakened immune system if you:  Have HIV or AIDS.  Are undergoing chemotherapy.  Aretaking medicines that reduce the activity of (suppress) the immune system.  Have a long-term (chronic) illness, such as heart disease, kidney disease, diabetes, or lung disease.  Have a liver disorder.  Are obese.  Have anemia. What are the signs  or symptoms? Symptoms of this condition typically last 4-10 days and may include:  Fever.  Chills.  Headache, body aches, or muscle aches.  Sore throat.  Cough.  Runny or congested nose.  Chest discomfort and cough.  Poor appetite.  Weakness or tiredness (fatigue).  Dizziness.  Nausea or vomiting. How is this diagnosed? This condition may be diagnosed based on your medical history and a physical exam. Your health care provider may do a nose or throat swab test to confirm the diagnosis. How is this treated? If influenza is detected early, you can be treated with antiviral medicine that can reduce the length of your illness and the severity of your symptoms. This medicine may be given by mouth (orally) or through an IV tube that is inserted in one of your veins. The goal of treatment is to relieve symptoms by taking care of yourself at home. This may include taking over-the-counter medicines, drinking plenty of fluids, and adding humidity to the air in your home. In some cases, influenza goes away on its own. Severe influenza or complications from influenza may be treated in a hospital. Follow these instructions at home:  Take over-the-counter and prescription medicines only as told by your health care provider.  Use a cool mist humidifier to add humidity to the air in your home. This can make breathing easier.  Rest as needed.  Drink enough fluid to keep your urine clear or pale yellow.  Cover your mouth and nose when you cough or sneeze.  Wash your hands with soap and water often, especially  after you cough or sneeze. If soap and water are not available, use hand sanitizer.  Stay home from work or school as told by your health care provider. Unless you are visiting your health care provider, try to avoid leaving home until your fever has been gone for 24 hours without the use of medicine.  Keep all follow-up visits as told by your health care provider. This is  important. How is this prevented?  Getting an annual flu shot is the best way to avoid getting the flu. You may get the flu shot in late summer, fall, or winter. Ask your health care provider when you should get your flu shot.  Wash your hands often or use hand sanitizer often.  Avoid contact with people who are sick during cold and flu season.  Eat a healthy diet, drink plenty of fluids, get enough sleep, and exercise regularly. Contact a health care provider if:  You develop new symptoms.  You have:  Chest pain.  Diarrhea.  A fever.  Your cough gets worse.  You produce more mucus.  You feel nauseous or you vomit. Get help right away if:  You develop shortness of breath or difficulty breathing.  Your skin or nails turn a bluish color.  You have severe pain or stiffness in your neck.  You develop a sudden headache or sudden pain in your face or ear.  You cannot stop vomiting. This information is not intended to replace advice given to you by your health care provider. Make sure you discuss any questions you have with your health care provider. Document Released: 10/22/2000 Document Revised: 04/01/2016 Document Reviewed: 08/19/2015 Elsevier Interactive Patient Education  2017 [employer]    Sore Throat A sore throat is pain, burning, irritation, or scratchiness in the throat. When you have a sore throat, you may feel pain or tenderness in your throat when you swallow or talk. Many things can cause a sore throat, including:  An infection.  Seasonal allergies.  Dryness in the air.  Irritants, such as smoke or pollution.  Gastroesophageal reflux disease (GERD).  A tumor. A sore throat is often the first sign of another sickness. It may happen with other symptoms, such as coughing, sneezing, fever, and swollen neck glands. Most sore throats go away without medical treatment. Follow these instructions at home:  Take over-the-counter medicines only as told by  your health care provider.  Drink enough fluids to keep your urine clear or pale yellow.  Rest as needed.  To help with pain, try:  Sipping warm liquids, such as broth, herbal tea, or warm water.  Eating or drinking cold or frozen liquids, such as frozen ice pops.  Gargling with a salt-water mixture 3-4 times a day or as needed. To make a salt-water mixture, completely dissolve -1 tsp of salt in 1 cup of warm water.  Sucking on hard candy or throat lozenges.  Putting a cool-mist humidifier in your bedroom at night to moisten the air.  Sitting in the bathroom with the door closed for 5-10 minutes while you run hot water in the shower.  Do not use any tobacco products, such as cigarettes, chewing tobacco, and e-cigarettes. If you need help quitting, ask your health care provider. Contact a health care provider if:  You have a fever for more than 2-3 days.  You have symptoms that last (are persistent) for more than 2-3 days.  Your throat does not get better within 7 days.  You have a fever  and your symptoms suddenly get worse. Get help right away if:  You have difficulty breathing.  You cannot swallow fluids, soft foods, or your saliva.  You have increased swelling in your throat or neck.  You have persistent nausea and vomiting. This information is not intended to replace advice given to you by your health care provider. Make sure you discuss any questions you have with your health care provider. Document Released: 12/02/2004 Document Revised: 06/20/2016 Document Reviewed: 08/15/2015 Elsevier Interactive Patient Education  2017 [employer].   IF you received an x-ray today, you will receive an invoice from [facility]. Please contact [facility] at [phone] with questions or concerns regarding your invoice.   IF you received labwork today, you will receive an invoice from [place]. Please contact [facility] at [phone] with questions or concerns  regarding your invoice.   Our billing staff will not be able to assist you with questions regarding bills from these companies.  You will be contacted with the lab results as soon as they are available. The fastest way to get your results is to activate your My Chart account. Instructions are located on the last page of this paperwork. If you have not heard from [place] regarding the results in 2 weeks, please contact this office.

## 2017-02-07 ENCOUNTER — Ambulatory Visit (INDEPENDENT_AMBULATORY_CARE_PROVIDER_SITE_OTHER): Payer: 59 | Admitting: Urgent Care

## 2017-02-07 VITALS — BP 135/85 | HR 76 | Temp 98.4°F | Resp 17 | Ht 69.0 in | Wt 222.0 lb

## 2017-02-07 DIAGNOSIS — J4531 Mild persistent asthma with (acute) exacerbation: Secondary | ICD-10-CM

## 2017-02-07 DIAGNOSIS — R062 Wheezing: Secondary | ICD-10-CM | POA: Diagnosis not present

## 2017-02-07 DIAGNOSIS — R05 Cough: Secondary | ICD-10-CM

## 2017-02-07 DIAGNOSIS — R6889 Other general symptoms and signs: Secondary | ICD-10-CM | POA: Diagnosis not present

## 2017-02-07 DIAGNOSIS — R059 Cough, unspecified: Secondary | ICD-10-CM

## 2017-02-07 DIAGNOSIS — J101 Influenza due to other identified influenza virus with other respiratory manifestations: Secondary | ICD-10-CM

## 2017-02-07 LAB — CULTURE, GROUP A STREP: STREP A CULTURE: NEGATIVE

## 2017-02-07 MED ORDER — AZITHROMYCIN 250 MG PO TABS: ORAL_TABLET | ORAL | 0 refills | Status: DC

## 2017-02-07 MED ORDER — IPRATROPIUM BROMIDE 0.02 % IN SOLN
0.5000 mg | Freq: Once | RESPIRATORY_TRACT | Status: AC
  Administered 2017-02-07: 0.5 mg via RESPIRATORY_TRACT

## 2017-02-07 MED ORDER — PSEUDOEPHEDRINE HCL 60 MG PO TABS: 60.0000 mg | ORAL_TABLET | Freq: Three times a day (TID) | ORAL | 0 refills | Status: AC | PRN

## 2017-02-07 MED ORDER — ALBUTEROL SULFATE (2.5 MG/3ML) 0.083% IN NEBU
2.5000 mg | INHALATION_SOLUTION | Freq: Once | RESPIRATORY_TRACT | Status: AC
  Administered 2017-02-07: 2.5 mg via RESPIRATORY_TRACT

## 2017-02-07 NOTE — Patient Instructions (Addendum)
Cough, Adult Coughing is a reflex that clears your throat and your airways. Coughing helps to heal and protect your lungs. It is normal to cough occasionally, but a cough that happens with other symptoms or lasts a long time may be a sign of a condition that needs treatment. A cough may last only 2-3 weeks (acute), or it may last longer than 8 weeks (chronic). What are the causes? Coughing is commonly caused by:  Breathing in substances that irritate your lungs.  A viral or bacterial respiratory infection.  Allergies.  Asthma.  Postnasal drip.  Smoking.  Acid backing up from the stomach into the esophagus (gastroesophageal reflux).  Certain medicines.  Chronic lung problems, including COPD (or rarely, lung cancer).  Other medical conditions such as heart failure. Follow these instructions at home: Pay attention to any changes in your symptoms. Take these actions to help with your discomfort:  Take medicines only as told by your health care provider.  If you were prescribed an antibiotic medicine, take it as told by your health care provider. Do not stop taking the antibiotic even if you start to feel better.  Talk with your health care provider before you take a cough suppressant medicine.  Drink enough fluid to keep your urine clear or pale yellow.  If the air is dry, use a cold steam vaporizer or humidifier in your bedroom or your home to help loosen secretions.  Avoid anything that causes you to cough at work or at home.  If your cough is worse at night, try sleeping in a semi-upright position.  Avoid cigarette smoke. If you smoke, quit smoking. If you need help quitting, ask your health care provider.  Avoid caffeine.  Avoid alcohol.  Rest as needed. Contact a health care provider if:  You have new symptoms.  You cough up pus.  Your cough does not get better after 2-3 weeks, or your cough gets worse.  You cannot control your cough with suppressant medicines  and you are losing sleep.  You develop pain that is getting worse or pain that is not controlled with pain medicines.  You have a fever.  You have unexplained weight loss.  You have night sweats. Get help right away if:  You cough up blood.  You have difficulty breathing.  Your heartbeat is very fast. This information is not intended to replace advice given to you by your health care provider. Make sure you discuss any questions you have with your health care provider. Document Released: 04/23/2011 Document Revised: 04/01/2016 Document Reviewed: 01/01/2015 Elsevier Interactive Patient Education  2017 [place].     Asthma, Adult Asthma is a recurring condition in which the airways tighten and narrow. Asthma can make it difficult to breathe. It can cause coughing, wheezing, and shortness of breath. Asthma episodes, also called asthma attacks, range from minor to life-threatening. Asthma cannot be cured, but medicines and lifestyle changes can help control it. What are the causes? Asthma is believed to be caused by inherited (genetic) and environmental factors, but its exact cause is unknown. Asthma may be triggered by allergens, lung infections, or irritants in the air. Asthma triggers are different for each person. Common triggers include:  Animal dander.  Dust mites.  Cockroaches.  Pollen from trees or grass.  Mold.  Smoke.  Air pollutants such as dust, household cleaners, hair sprays, aerosol sprays, paint fumes, strong chemicals, or strong odors.  Cold air, weather changes, and winds (which increase molds and pollens in the  air).  Strong emotional expressions such as crying or laughing hard.  Stress.  Certain medicines (such as aspirin) or types of drugs (such as beta-blockers).  Sulfites in foods and drinks. Foods and drinks that may contain sulfites include dried fruit, potato chips, and sparkling grape juice.  Infections or inflammatory conditions such as  the flu, a cold, or an inflammation of the nasal membranes (rhinitis).  Gastroesophageal reflux disease (GERD).  Exercise or strenuous activity. What are the signs or symptoms? Symptoms may occur immediately after asthma is triggered or many hours later. Symptoms include:  Wheezing.  Excessive nighttime or early morning coughing.  Frequent or severe coughing with a common cold.  Chest tightness.  Shortness of breath. How is this diagnosed? The diagnosis of asthma is made by a review of your medical history and a physical exam. Tests may also be performed. These may include:  Lung function studies. These tests show how much air you breathe in and out.  Allergy tests.  Imaging tests such as X-rays. How is this treated? Asthma cannot be cured, but it can usually be controlled. Treatment involves identifying and avoiding your asthma triggers. It also involves medicines. There are 2 classes of medicine used for asthma treatment:  Controller medicines. These prevent asthma symptoms from occurring. They are usually taken every day.  Reliever or rescue medicines. These quickly relieve asthma symptoms. They are used as needed and provide short-term relief. Your health care provider will help you create an asthma action plan. An asthma action plan is a written plan for managing and treating your asthma attacks. It includes a list of your asthma triggers and how they may be avoided. It also includes information on when medicines should be taken and when their dosage should be changed. An action plan may also involve the use of a device called a peak flow meter. A peak flow meter measures how well the lungs are working. It helps you monitor your condition. Follow these instructions at home:  Take medicines only as directed by your health care provider. Speak with your health care provider if you have questions about how or when to take the medicines.  Use a peak flow meter as directed by your  health care provider. Record and keep track of readings.  Understand and use the action plan to help minimize or stop an asthma attack without needing to seek medical care.  Control your home environment in the following ways to help prevent asthma attacks:  Do not smoke. Avoid being exposed to secondhand smoke.  Change your heating and air conditioning filter regularly.  Limit your use of fireplaces and wood stoves.  Get rid of pests (such as roaches and mice) and their droppings.  Throw away plants if you see mold on them.  Clean your floors and dust regularly. Use unscented cleaning products.  Try to have someone else vacuum for you regularly. Stay out of rooms while they are being vacuumed and for a short while afterward. If you vacuum, use a dust mask from a hardware store, a double-layered or microfilter vacuum cleaner bag, or a vacuum cleaner with a HEPA filter.  Replace carpet with wood, tile, or vinyl flooring. Carpet can trap dander and dust.  Use allergy-proof pillows, mattress covers, and box spring covers.  Wash bed sheets and blankets every week in hot water and dry them in a dryer.  Use blankets that are made of polyester or cotton.  Clean bathrooms and kitchens with bleach. If possible,  have someone repaint the walls in these rooms with mold-resistant paint. Keep out of the rooms that are being cleaned and painted.  Wash hands frequently. Contact a health care provider if:  You have wheezing, shortness of breath, or a cough even if taking medicine to prevent attacks.  The colored mucus you cough up (sputum) is thicker than usual.  Your sputum changes from clear or white to yellow, green, gray, or bloody.  You have any problems that may be related to the medicines you are taking (such as a rash, itching, swelling, or trouble breathing).  You are using a reliever medicine more than 2-3 times per week.  Your peak flow is still at 50-79% of your personal best  after following your action plan for 1 hour.  You have a fever. Get help right away if:  You seem to be getting worse and are unresponsive to treatment during an asthma attack.  You are short of breath even at rest.  You get short of breath when doing very little physical activity.  You have difficulty eating, drinking, or talking due to asthma symptoms.  You develop chest pain.  You develop a fast heartbeat.  You have a bluish color to your lips or fingernails.  You are light-headed, dizzy, or faint.  Your peak flow is less than 50% of your personal best. This information is not intended to replace advice given to you by your health care provider. Make sure you discuss any questions you have with your health care provider. Document Released: 10/25/2005 Document Revised: 04/07/2016 Document Reviewed: 05/24/2013 Elsevier Interactive Patient Education  2017 [employer].     IF you received an x-ray today, you will receive an invoice from [facility]. Please contact [facility] at [phone] with questions or concerns regarding your invoice.   IF you received labwork today, you will receive an invoice from [facility]. Please contact [facility] at [phone] with questions or concerns regarding your invoice.   Our billing staff will not be able to assist you with questions regarding bills from these companies.  You will be contacted with the lab results as soon as they are available. The fastest way to get your results is to activate your My Chart account. Instructions are located on the last page of this paperwork. If you have not heard from [place] regarding the results in 2 weeks, please contact this office.

## 2017-02-07 NOTE — Progress Notes (Signed)
   MRN: [id number] DOB: 07-19-1961  Subjective:   Christopher Bolton is a 56 y.o. male presenting for follow up on influenza. Last OV was 02/04/2017. Patient was given cough suppression medications at his last visit, advised to restart his allergy medications. Strep culture and x-ray were negative. Today, he reports persistent cough. Laying flat elicits cough as well, feels like he sounds congested in his chest and has rattling. However he admits that he feels better. Denies fever, chest pain, shob. Denies smoking cigarettes.  Kyo has a current medication list which includes the following prescription(s): albuterol, amphetamine-dextroamphetamine, benzonatate, bupropion, fluticasone, hydrocodone-homatropine, levocetirizine, losartan, montelukast, and oseltamivir. Also is allergic to apricot flavor; cantaloupe (diagnostic); mango flavor; other; peach flavor; watermelon flavor [flavoring agent]; and ciprofloxacin.  Christopher Bolton  has a past medical history of Attention deficit disorder; Degenerative joint disease (2012); Dental crowns present; Depression; History of asthma; History of diverticulitis (2016); History of kidney stones; adenomatous colonic polyps (06/2015); Hypertension; Limited joint range of motion; Seasonal allergies; and Trigger finger, left ring finger (06/2016). Also  has a past surgical history that includes Cholecystectomy; Cystoscopy/retrograde/ureteroscopy (10/08/2012); Trigger finger release (Right, 02/22/2011); Cystoscopy with retrograde pyelogram, ureteroscopy and stent placement (10/27/2012); Holmium laser application (34/37/3578); Cystoscopy w/ ureteral stent removal (10/27/2012); Cervical fusion (2014); Photorefractive keratotomy (Left); Colonoscopy with propofol (06/30/2015); and Trigger finger release (Left, 07/08/2016).  Objective:   Vitals: BP 135/85   Pulse 76   Temp 98.4 F (36.9 C) (Oral)   Resp 17   Ht 5\' 9"  (1.753 m)   Wt 222 lb (100.7 kg)   SpO2 97%   BMI 32.78 kg/m    Physical Exam  Constitutional: He is oriented to person, place, and time. He appears well-developed and well-nourished.  HENT:  Mouth/Throat: Oropharynx is clear and moist.  Eyes: No scleral icterus.  Cardiovascular: Normal rate, regular rhythm and intact distal pulses.  Exam reveals no gallop and no friction rub.   No murmur heard. Pulmonary/Chest: No respiratory distress. He has wheezes (bilateral). He has no rales.  Neurological: He is alert and oriented to person, place, and time.  Psychiatric: He has a normal mood and affect.   Wheezing resolved s/p nebulizer treatment but has mild right side rhonchi.   Assessment and Plan :   1. Throat congestion 2. 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 Wheezing 4. Cough 5. Influenza B - Advised patient to schedule albuterol inhaler 6 hours apart. Maintain supportive care and add Sudafed 60mg  as needed for congestion. Patient will use script for azithromycin to address lower respiratory infection if not better by Thursday-Friday. He will call, e-mail if he needs a script for albuterol nebulizer solution.  Jaynee Eagles, PA-C [facility] [phone] 02/07/2017 12:23 PM

## 2017-02-09 ENCOUNTER — Encounter: Payer: Self-pay | Admitting: Urgent Care

## 2017-02-10 ENCOUNTER — Encounter: Payer: Self-pay | Admitting: Urgent Care

## 2017-02-10 ENCOUNTER — Other Ambulatory Visit: Payer: Self-pay

## 2017-02-10 MED ORDER — ALBUTEROL SULFATE (2.5 MG/3ML) 0.083% IN NEBU: 2.5000 mg | INHALATION_SOLUTION | Freq: Four times a day (QID) | RESPIRATORY_TRACT | 1 refills | Status: AC | PRN

## 2017-02-12 MED ORDER — HYDROCOD POLST-CPM POLST ER 10-8 MG/5ML PO SUER: 5.0000 mL | Freq: Every evening | ORAL | 0 refills | Status: DC | PRN

## 2017-02-12 NOTE — Addendum Note (Signed)
Addended by: Jaynee Eagles on: 02/12/2017 03:06 PM   Modules accepted: Orders

## 2017-04-25 DIAGNOSIS — R972 Elevated prostate specific antigen [PSA]: Secondary | ICD-10-CM | POA: Diagnosis not present

## 2017-05-31 DIAGNOSIS — N200 Calculus of kidney: Secondary | ICD-10-CM | POA: Diagnosis not present

## 2017-05-31 DIAGNOSIS — R972 Elevated prostate specific antigen [PSA]: Secondary | ICD-10-CM | POA: Diagnosis not present

## 2017-05-31 DIAGNOSIS — R34 Anuria and oliguria: Secondary | ICD-10-CM | POA: Diagnosis not present

## 2017-06-20 DIAGNOSIS — R972 Elevated prostate specific antigen [PSA]: Secondary | ICD-10-CM

## 2017-06-27 ENCOUNTER — Other Ambulatory Visit: Payer: Self-pay | Admitting: Family Medicine

## 2017-06-27 DIAGNOSIS — I10 Essential (primary) hypertension: Secondary | ICD-10-CM

## 2017-07-05 ENCOUNTER — Encounter: Payer: Self-pay | Admitting: Family Medicine

## 2017-07-07 ENCOUNTER — Other Ambulatory Visit: Payer: Self-pay

## 2017-07-07 DIAGNOSIS — I10 Essential (primary) hypertension: Secondary | ICD-10-CM

## 2017-07-07 MED ORDER — LOSARTAN POTASSIUM 50 MG PO TABS: 50.0000 mg | ORAL_TABLET | Freq: Every day | ORAL | 0 refills | Status: DC

## 2017-09-26 ENCOUNTER — Ambulatory Visit (INDEPENDENT_AMBULATORY_CARE_PROVIDER_SITE_OTHER): Payer: 59 | Admitting: Family Medicine

## 2017-09-26 ENCOUNTER — Other Ambulatory Visit: Payer: Self-pay

## 2017-09-26 ENCOUNTER — Encounter: Payer: Self-pay | Admitting: Family Medicine

## 2017-09-26 VITALS — HR 75 | Temp 98.2°F | Resp 16 | Ht 68.11 in | Wt 227.0 lb

## 2017-09-26 DIAGNOSIS — Z13220 Encounter for screening for lipoid disorders: Secondary | ICD-10-CM

## 2017-09-26 DIAGNOSIS — Z0000 Encounter for general adult medical examination without abnormal findings: Secondary | ICD-10-CM | POA: Diagnosis not present

## 2017-09-26 DIAGNOSIS — R05 Cough: Secondary | ICD-10-CM | POA: Diagnosis not present

## 2017-09-26 DIAGNOSIS — Z23 Encounter for immunization: Secondary | ICD-10-CM

## 2017-09-26 DIAGNOSIS — M542 Cervicalgia: Secondary | ICD-10-CM | POA: Diagnosis not present

## 2017-09-26 DIAGNOSIS — R7989 Other specified abnormal findings of blood chemistry: Secondary | ICD-10-CM

## 2017-09-26 DIAGNOSIS — I10 Essential (primary) hypertension: Secondary | ICD-10-CM

## 2017-09-26 DIAGNOSIS — R059 Cough, unspecified: Secondary | ICD-10-CM

## 2017-09-26 MED ORDER — LOSARTAN POTASSIUM 50 MG PO TABS: 50.0000 mg | ORAL_TABLET | Freq: Every day | ORAL | 2 refills | Status: DC

## 2017-09-26 NOTE — Patient Instructions (Addendum)
I can check testosterone, but if low would need to be repeated at least once between 8 and 10 am, and then would recommend discussing treatment with [name].   Please follow up to discuss neck and back symptoms further to decide next step. Please follow up to discuss plan further before flexeril run out.   Try singulair once per day, restart flonase for allergies, avoid foods that can worsen heartburn, but can also try Zantac if needed.  If cough is not improving in next few weeks, or worsening sooner - return to decide next step.    Food Choices for Gastroesophageal Reflux Disease, Adult When you have gastroesophageal reflux disease (GERD), the foods you eat and your eating habits are very important. Choosing the right foods can help ease your discomfort. What guidelines do I need to follow?  Choose fruits, vegetables, whole grains, and low-fat dairy products.  Choose low-fat meat, fish, and poultry.  Limit fats such as oils, salad dressings, butter, nuts, and avocado.  Keep a food diary. This helps you identify foods that cause symptoms.  Avoid foods that cause symptoms. These may be different for everyone.  Eat small meals often instead of 3 large meals a day.  Eat your meals slowly, in a place where you are relaxed.  Limit fried foods.  Cook foods using methods other than frying.  Avoid drinking alcohol.  Avoid drinking large amounts of liquids with your meals.  Avoid bending over or lying down until 2-3 hours after eating. What foods are not recommended? These are some foods and drinks that may make your symptoms worse: Vegetables Tomatoes. Tomato juice. Tomato and spaghetti sauce. Chili peppers. Onion and garlic. Horseradish. Fruits Oranges, grapefruit, and lemon (fruit and juice). Meats High-fat meats, fish, and poultry. This includes hot dogs, ribs, ham, sausage, salami, and bacon. Dairy Whole milk and chocolate milk. Sour cream. Cream. Butter. Ice cream. Cream  cheese. Drinks Coffee and tea. Bubbly (carbonated) drinks or energy drinks. Condiments Hot sauce. Barbecue sauce. Sweets/Desserts Chocolate and cocoa. Donuts. Peppermint and spearmint. Fats and Oils High-fat foods. This includes [place] fries and potato chips. Other Vinegar. Strong spices. This includes black pepper, white pepper, red pepper, cayenne, curry powder, cloves, ginger, and chili powder. The items listed above may not be a complete list of foods and drinks to avoid. Contact your dietitian for more information. This information is not intended to replace advice given to you by your health care provider. Make sure you discuss any questions you have with your health care provider. Document Released: 04/25/2012 Document Revised: 04/01/2016 Document Reviewed: 08/29/2013 Elsevier Interactive Patient Education  2017 [employer] Inc[employer]   Cough, Adult Coughing is a reflex that clears your throat and your airways. Coughing helps to heal and protect your lungs. It is normal to cough occasionally, but a cough that happens with other symptoms or lasts a long time may be a sign of a condition that needs treatment. A cough may last only 2-3 weeks (acute), or it may last longer than 8 weeks (chronic). What are the causes? Coughing is commonly caused by:  Breathing in substances that irritate your lungs.  A viral or bacterial respiratory infection.  Allergies.  Asthma.  Postnasal drip.  Smoking.  Acid backing up from the stomach into the esophagus (gastroesophageal reflux).  Certain medicines.  Chronic lung problems, including COPD (or rarely, lung cancer).  Other medical conditions such as heart failure.  Follow these instructions at home: Pay attention to any changes in your  symptoms. Take these actions to help with your discomfort:  Take medicines only as told by your health care provider. ? If you were prescribed an antibiotic medicine, take it as told by your health care  provider. Do not stop taking the antibiotic even if you start to feel better. ? Talk with your health care provider before you take a cough suppressant medicine.  Drink enough fluid to keep your urine clear or pale yellow.  If the air is dry, use a cold steam vaporizer or humidifier in your bedroom or your home to help loosen secretions.  Avoid anything that causes you to cough at work or at home.  If your cough is worse at night, try sleeping in a semi-upright position.  Avoid cigarette smoke. If you smoke, quit smoking. If you need help quitting, ask your health care provider.  Avoid caffeine.  Avoid alcohol.  Rest as needed.  Contact a health care provider if:  You have new symptoms.  You cough up pus.  Your cough does not get better after 2-3 weeks, or your cough gets worse.  You cannot control your cough with suppressant medicines and you are losing sleep.  You develop pain that is getting worse or pain that is not controlled with pain medicines.  You have a fever.  You have unexplained weight loss.  You have night sweats. Get help right away if:  You cough up blood.  You have difficulty breathing.  Your heartbeat is very fast. This information is not intended to replace advice given to you by your health care provider. Make sure you discuss any questions you have with your health care provider. Document Released: 04/23/2011 Document Revised: 04/01/2016 Document Reviewed: 01/01/2015 Elsevier Interactive Patient Education  2017 [place] you healthy  Get these tests  Blood pressure- Have your blood pressure checked once a year by your healthcare provider.  Normal blood pressure is 120/80  Weight- Have your body mass index (BMI) calculated to screen for obesity.  BMI is a measure of body fat based on height and weight. You can also calculate your own BMI at [email or web address].  Cholesterol- Have your cholesterol checked every  year.  Diabetes- Have your blood sugar checked regularly if you have high blood pressure, high cholesterol, have a family history of diabetes or if you are overweight.  Screening for Colon Cancer- Colonoscopy starting at age 32.  Screening may begin sooner depending on your family history and other health conditions. Follow up colonoscopy as directed by your Gastroenterologist.  Screening for Prostate Cancer- Both blood work (PSA) and a rectal exam help screen for Prostate Cancer.  Screening begins at age 37 with African-American men and at age 56 with Caucasian men.  Screening may begin sooner depending on your family history.  Take these medicines  Aspirin- One aspirin daily can help prevent Heart disease and Stroke.  Flu shot- Every fall.  Tetanus- Every 10 years.  Zostavax- Once after the age of 67 to prevent Shingles.  Pneumonia shot- Once after the age of 6; if you are younger than 42, ask your healthcare provider if you need a Pneumonia shot.  Take these steps  Don't smoke- If you do smoke, talk to your doctor about quitting.  For tips on how to quit, go to www.smokefree.gov or call 1-800-QUIT-NOW.  Be physically active- Exercise 5 days a week for at least 30 minutes.  If you are not already physically active start slow and gradually work up to 30  minutes of moderate physical activity.  Examples of moderate activity include walking briskly, mowing the yard, dancing, swimming, bicycling, etc.  Eat a healthy diet- Eat a variety of healthy food such as fruits, vegetables, low fat milk, low fat cheese, yogurt, lean meant, poultry, fish, beans, tofu, etc. For more information go to www.thenutritionsource.org  Drink alcohol in moderation- Limit alcohol intake to less than two drinks a day. Never drink and drive.  Dentist- Brush and floss twice daily; visit your dentist twice a year.  Depression- Your emotional health is as important as your physical health. If you're feeling down,  or losing interest in things you would normally enjoy please talk to your healthcare provider.  Eye exam- Visit your eye doctor every year.  Safe sex- If you may be exposed to a sexually transmitted infection, use a condom.  Seat belts- Seat belts can save your life; always wear one.  Smoke/Carbon Monoxide detectors- These detectors need to be installed on the appropriate level of your home.  Replace batteries at least once a year.  Skin cancer- When out in the sun, cover up and use sunscreen 15 SPF or higher.  Violence- If anyone is threatening you, please tell your healthcare provider.  Living Will/ Health care power of attorney- Speak with your healthcare provider and family.   IF you received an x-ray today, you will receive an invoice from [facility]. Please contact [facility] at [phone] with questions or concerns regarding your invoice.   IF you received labwork today, you will receive an invoice from [place]. Please contact [facility] at [phone] with questions or concerns regarding your invoice.   Our billing staff will not be able to assist you with questions regarding bills from these companies.  You will be contacted with the lab results as soon as they are available. The fastest way to get your results is to activate your My Chart account. Instructions are located on the last page of this paperwork. If you have not heard from [place] regarding the results in 2 weeks, please contact this office.

## 2017-09-26 NOTE — Progress Notes (Signed)
Subjective:  By signing my name below, I, Moises Blood, attest that this documentation has been prepared under the direction and in the presence of [name], MD. Electronically Signed: Moises Blood, Swan. 09/26/2017 , 11:28 AM .  Patient was seen in Room 11 .   Patient ID: Christopher Bolton, male    DOB: 1961-04-01, 56 y.o.   MRN: [id number] Chief Complaint  Patient presents with  . Annual Exam    patient has biometric lab forms to be filled out   HPI Christopher Bolton is a 56 y.o. male Here for annual physical. Patient has a history of HTN, cervical DDD, allergies, testosterone deficiency, and rising PSA level. He is fasting today.   Trouble sleeping + neck/back pain Patient notes trouble sleeping with neck + back pain. 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 He tries to stretch all the time, but cold weather usually aggravates his neck and back pain. [name] previously prescribed cyclobenzaprine to give him some relief when he really couldn't sleep; patient reports still having about 30 tablets at home. He usually takes about 1-2 a week in Spring/Summer, and about 3 a week during Fall/Winter. He had CT abdomen/pelvis done for diverticulitis in 2016, but also noted to have severe disc space loss at L5-S1.   Cough - seasonal allergies Patient reports having flu earlier this year, with lingering nasal congestion, cough and drainage down the back of his throat. He usually uses Albuterol about once a month. He's used Singulair and Flonase in the past, but hasn't recently. He denies any fever or shortness of breath. He hasn't taken Xyzal since Spring this year.   Heartburn: He notes having more heartburn recently. He hasn't taken any OTC heartburn medication.   HTN Lab Results  Component Value Date   CREATININE 1.15 10/07/2016   He takes Losartan 50mg  QD.   Cancer Screening Colonoscopy: last done in Aug 2016, by [name]; 3 polyps removed and 4  adenomas; repeat colonoscopy in 2019.   Prostate cancer screening: history of increasing PSA, followed by [facility], [name]. Biopsy done in Aug 2017, benign prostate tissue except for 1 area of small focus of atypical glands. He last saw urologist, [name], in July with PSA and DRE testing. He's usually seen every 6 months. He's not taking testosterone supplement right now due to concern of PSA. His testosterone was 280 in Dec 2016.   Immunizations Immunization History  Administered Date(s) Administered  . Influenza,inj,Quad PF,6+ Mos 10/05/2016  . Influenza-Unspecified 08/08/2014  . Tdap 07/31/2015   Flu shot: received today.  Pneumonia vaccine: believes he's received it about 8 years ago.   Depression Depression screen [facility] 2/9 09/26/2017 02/07/2017 02/04/2017 02/02/2017 01/24/2017  Decreased Interest 0 0 0 0 0  Down, Depressed, Hopeless 1 0 0 0 0  PHQ - 2 Score 1 0 0 0 0   He's taken wellbutrin 150mg , prescribed by [name]. He's also prescribed Adderall   Vision  Visual Acuity Screening   Right eye Left eye Both eyes  Without correction: 20/15 20/70 20/15   With correction:       Dentist He's followed by dentist, seen every 6 months.   Exercise He exercises about 1-2 days a week. He also notes doing some yard work.   Patient Active Problem List   Diagnosis Date Noted  . Testosterone deficiency 01/22/2016  . Rising PSA level 01/22/2016  . Hx of adenomatous colonic polyps 07/03/2015  .  Kidney stones 04/21/2015  . DDD (degenerative disc disease), cervical 02/27/2013  . HTN (hypertension) 02/27/2013  . Allergic rhinitis 02/27/2013   Past Medical History:  Diagnosis Date  . Attention deficit disorder   . Degenerative joint disease 2012  . Dental crowns present   . Depression   . History of asthma    no current med.; is triggered by seasonal allergies  . History of diverticulitis 2016  . History of kidney stones   . Hx of adenomatous colonic polyps 06/2015    . Hypertension    states under control with med.(Intuniv); has been on med. x 3 yr.  . Limited joint range of motion    cervical spine  . Seasonal allergies   . Trigger finger, left ring finger 06/2016   Past Surgical History:  Procedure Laterality Date  . CERVICAL FUSION  2014  . CHOLECYSTECTOMY    . COLONOSCOPY WITH PROPOFOL  06/30/2015  . CYSTOSCOPY WITH RETROGRADE PYELOGRAM, URETEROSCOPY AND STENT PLACEMENT Left 10/27/2012   Performed by [name], MD at [facility]  . CYSTOSCOPY WITH STENT REMOVAL Left 10/27/2012   Performed by [name], MD at [facility]  . CYSTOSCOPY/RETROGRADE/URETEROSCOPY Left 10/08/2012   Performed by [name], MD at [facility] OR[facility]  . HOLMIUM LASER APPLICATION Left [id number]/8295   Performed by [name], MD at [facility]  . PHOTOREFRACTIVE KERATOTOMY Left   . RELEASE TRIGGER FINGER/A-1 PULLEY left ring finger Left 07/08/2016   Performed by [name], MD at [facility]  . TRIGGER FINGER RELEASE Right 02/22/2011   long finger   Allergies  Allergen Reactions  . Apricot Flavor Other (See Comments)    ITCHING OF THROAT  . Cantaloupe (Diagnostic) Other (See Comments)    ALSO HONEYDEW MELON - ITCHING OF THROAT  . Mango Flavor Other (See Comments)    ITCHING OF THROAT  . Other Other (See Comments)    FILBERT'S NUTS - ITCHING OF THROAT  . Peach Flavor Other (See Comments)    ITCHING OF THROAT  . Watermelon Flavor [Flavoring Agent] Other (See Comments)    ITCHING OF THROAT  . Ciprofloxacin Itching   Prior to Admission medications   Medication Sig Start Date End Date Taking? Authorizing Provider  albuterol (PROVENTIL HFA;VENTOLIN HFA) 108 (90 Base) MCG/ACT inhaler Inhale 1-2 puffs into the lungs every 6 (six) hours as needed for wheezing or shortness of breath. 01/30/17  Yes Wendie Agreste, MD  albuterol (PROVENTIL) (2.5 MG/3ML) 0.083% nebulizer solution Take 3 mLs (2.5 mg  total) by nebulization every 6 (six) hours as needed for wheezing or shortness of breath. 02/10/17  Yes Jaynee Eagles, PA-C  amphetamine-dextroamphetamine (ADDERALL) 10 MG tablet Take 50 mg by mouth daily with breakfast.   Yes [provider]  buPROPion (WELLBUTRIN XL) 150 MG 24 hr tablet Take 450 mg by mouth daily.   Yes [provider]  fluticasone (FLONASE) 50 MCG/ACT nasal spray Place 2 sprays into both nostrils daily. 01/30/17  Yes Wendie Agreste, MD  levocetirizine (XYZAL) 5 MG tablet Take 1 tablet (5 mg total) by mouth every evening. 01/30/17  Yes Wendie Agreste, MD  losartan (COZAAR) 50 MG tablet Take 1 tablet (50 mg total) by mouth daily. OFFICE VISIT NEEDED FOR REFILLS 07/07/17  Yes Wendie Agreste, MD  montelukast (SINGULAIR) 10 MG tablet Take 1 tablet (10 mg total) by mouth at bedtime. 01/30/17  Yes Wendie Agreste, MD  pseudoephedrine (SUDAFED) 60  MG tablet Take 1 tablet (60 mg total) by mouth every 8 (eight) hours as needed. 02/07/17  Yes Jaynee Eagles, PA-C  azithromycin (ZITHROMAX) 250 MG tablet Start with 2 tablets today, then 1 daily thereafter. Patient not taking: Reported on 09/26/2017 02/07/17   Jaynee Eagles, PA-C  benzonatate (TESSALON) 100 MG capsule Take 1-2 capsules (100-200 mg total) by mouth 3 (three) times daily as needed. Patient not taking: Reported on 09/26/2017 02/03/17   Jaynee Eagles, PA-C  chlorpheniramine-HYDROcodone [facility]) 10-8 MG/5ML SUER Take 5 mLs by mouth at bedtime as needed. Patient not taking: Reported on 09/26/2017 02/12/17   Jaynee Eagles, PA-C  HYDROcodone-homatropine [facility]) 5-1.5 MG/5ML syrup Take 5 mLs by mouth at bedtime as needed. Patient not taking: Reported on 09/26/2017 02/04/17   Jaynee Eagles, PA-C  oseltamivir (TAMIFLU) 75 MG capsule Take 1 capsule (75 mg total) by mouth 2 (two) times daily. Patient not taking: Reported on 09/26/2017 02/02/17   Leonie Douglas, PA-C   Social History   Socioeconomic History  .  Marital status: Married    Spouse name: Not on file  . Number of children: Not on file  . Years of education: Not on file  . Highest education level: Not on file  Social Needs  . Financial resource strain: Not on file  . Food insecurity - worry: Not on file  . Food insecurity - inability: Not on file  . Transportation needs - medical: Not on file  . Transportation needs - non-medical: Not on file  Occupational History  . Occupation: system Printmaker: [employer]  Tobacco Use  . Smoking status: Never Smoker  . Smokeless tobacco: Never Used  Substance and Sexual Activity  . Alcohol use: No    Alcohol/week: 0.0 oz    Comment: occasionally  . Drug use: No  . Sexual activity: Yes  Other Topics Concern  . 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     Objective:   Physical Exam  Constitutional: He is oriented to person, place, and time. He appears well-developed and well-nourished.  HENT:  Head: Normocephalic and atraumatic.  Right Ear: External ear normal.  Left Ear: External ear normal.  Mouth/Throat: Oropharynx is clear and moist.  Eyes: Conjunctivae and EOM are normal. Pupils are equal, round, and reactive to light.  Neck: Normal range of motion. Neck supple. No thyromegaly present.  Cardiovascular: Normal rate, regular rhythm, normal heart sounds and intact distal pulses.  Pulmonary/Chest: Effort normal and breath sounds normal. No respiratory distress. He has no wheezes.  Abdominal: Soft. He exhibits no distension. There is no tenderness.  Musculoskeletal: Normal range of motion. He exhibits no edema or tenderness.  Lymphadenopathy:    He has no cervical adenopathy.  Neurological: He is alert and oriented to person, place, and time.  He has normal reflexes.  Skin: Skin is warm and dry.  Psychiatric: He has a normal mood and affect. His behavior is normal.  Vitals reviewed.   Vitals:   09/26/17 1024  Pulse: 75  Resp: 16  Temp: 98.2 F (36.8 C)  TempSrc: Oral  SpO2: 98%  Weight: 227 lb (103 kg)  Height: 5' 8.11" (1.73 m)      Assessment & Plan:  Mellody Memos  is a 56 y.o. male Annual physical exam  - -anticipatory guidance as below in AVS, screening labs above. Health maintenance items as above in HPI discussed/recommended as applicable.   -biometric screen for work paperwork.   Need for prophylactic vaccination and inoculation against influenza - Plan: Flu Vaccine QUAD 36+ mos IM  Low testosterone - Plan: Testosterone, Free, Total, SHBG  - check levels, but would defer treatment decisions to urology if low.   Essential hypertension - Plan: losartan (COZAAR) 50 MG tablet, Comprehensive metabolic panel  - check labs, continue same dose losartan  Screening for hyperlipidemia - Plan: Lipid panel  Cough  - allergies vs reflux possible. OTC treatment options and avoidance measures discussed. rtc precautions if persists.   Neck pain  - with back pain. Continue flexeril for now, plan to follow up to discuss prior treatment and plan further.   Meds ordered this encounter  Medications  . losartan (COZAAR) 50 MG tablet    Sig: Take 1 tablet (50 mg total) daily by mouth.    Dispense:  90 tablet    Refill:  2   Patient Instructions   I can check testosterone, but if low would need to be repeated at least once between 8 and 10 am, and then would recommend discussing treatment with [name].   Please follow up to discuss neck and back symptoms further to decide next step. Please follow up to discuss plan further before flexeril run out.   Try singulair once per day, restart flonase for allergies, avoid foods that can worsen heartburn, but can also try Zantac if needed.  If cough is not improving in next few  weeks, or worsening sooner - return to decide next step.    Food Choices for Gastroesophageal Reflux Disease, Adult When you have gastroesophageal reflux disease (GERD), the foods you eat and your eating habits are very important. Choosing the right foods can help ease your discomfort. What guidelines do I need to follow?  Choose fruits, vegetables, whole grains, and low-fat dairy products.  Choose low-fat meat, fish, and poultry.  Limit fats such as oils, salad dressings, butter, nuts, and avocado.  Keep a food diary. This helps you identify foods that cause symptoms.  Avoid foods that cause symptoms. These may be different for everyone.  Eat small meals often instead of 3 large meals a day.  Eat your meals slowly, in a place where you are relaxed.  Limit fried foods.  Cook foods using methods other than frying.  Avoid drinking alcohol.  Avoid drinking large amounts of liquids with your meals.  Avoid bending over or lying down until 2-3 hours after eating. What foods are not recommended? These are some foods and drinks that may make your symptoms worse: Vegetables Tomatoes. Tomato juice. Tomato and spaghetti sauce. Chili peppers. Onion and garlic. Horseradish. Fruits Oranges, grapefruit, and lemon (fruit and juice). Meats High-fat meats, fish, and poultry. This includes hot dogs, ribs, ham, sausage, salami, and bacon. Dairy Whole milk and chocolate milk. Sour cream. Cream. Butter. Ice cream. Cream cheese. Drinks Coffee and tea. Bubbly (carbonated) drinks or energy drinks. Condiments Hot sauce. Barbecue sauce. Sweets/Desserts Chocolate and cocoa. Donuts. Peppermint and spearmint. Fats and Oils High-fat foods. This includes [place] fries and potato chips. Other Vinegar. Strong spices. This includes black pepper, white pepper, red pepper, cayenne, curry powder, cloves, ginger, and chili powder. The items listed above may not be a complete list of foods and drinks to  avoid. Contact your dietitian  for more information. This information is not intended to replace advice given to you by your health care provider. Make sure you discuss any questions you have with your health care provider. Document Released: 04/25/2012 Document Revised: 04/01/2016 Document Reviewed: 08/29/2013 Elsevier Interactive Patient Education  2017 Elsevier Inc.   Cough, Adult Coughing is a reflex that clears your throat and your airways. Coughing helps to heal and protect your lungs. It is normal to cough occasionally, but a cough that happens with other symptoms or lasts a long time may be a sign of a condition that needs treatment. A cough may last only 2-3 weeks (acute), or it may last longer than 8 weeks (chronic). What are the causes? Coughing is commonly caused by:  Breathing in substances that irritate your lungs.  A viral or bacterial respiratory infection.  Allergies.  Asthma.  Postnasal drip.  Smoking.  Acid backing up from the stomach into the esophagus (gastroesophageal reflux).  Certain medicines.  Chronic lung problems, including COPD (or rarely, lung cancer).  Other medical conditions such as heart failure.  Follow these instructions at home: Pay attention to any changes in your symptoms. Take these actions to help with your discomfort:  Take medicines only as told by your health care provider. ? If you were prescribed an antibiotic medicine, take it as told by your health care provider. Do not stop taking the antibiotic even if you start to feel better. ? Talk with your health care provider before you take a cough suppressant medicine.  Drink enough fluid to keep your urine clear or pale yellow.  If the air is dry, use a cold steam vaporizer or humidifier in your bedroom or your home to help loosen secretions.  Avoid anything that causes you to cough at work or at home.  If your cough is worse at night, try sleeping in a semi-upright  position.  Avoid cigarette smoke. If you smoke, quit smoking. If you need help quitting, ask your health care provider.  Avoid caffeine.  Avoid alcohol.  Rest as needed.  Contact a health care provider if:  You have new symptoms.  You cough up pus.  Your cough does not get better after 2-3 weeks, or your cough gets worse.  You cannot control your cough with suppressant medicines and you are losing sleep.  You develop pain that is getting worse or pain that is not controlled with pain medicines.  You have a fever.  You have unexplained weight loss.  You have night sweats. Get help right away if:  You cough up blood.  You have difficulty breathing.  Your heartbeat is very fast. This information is not intended to replace advice given to you by your health care provider. Make sure you discuss any questions you have with your health care provider. Document Released: 04/23/2011 Document Revised: 04/01/2016 Document Reviewed: 01/01/2015 Elsevier Interactive Patient Education  2017 [place] you healthy  Get these tests  Blood pressure- Have your blood pressure checked once a year by your healthcare provider.  Normal blood pressure is 120/80  Weight- Have your body mass index (BMI) calculated to screen for obesity.  BMI is a measure of body fat based on height and weight. You can also calculate your own BMI at ViewBanking.si.  Cholesterol- Have your cholesterol checked every year.  Diabetes- Have your blood sugar checked regularly if you have high blood pressure, high cholesterol, have a family history of diabetes or if you are overweight.  Screening  for Colon Cancer- Colonoscopy starting at age 80.  Screening may begin sooner depending on your family history and other health conditions. Follow up colonoscopy as directed by your Gastroenterologist.  Screening for Prostate Cancer- Both blood work (PSA) and a rectal exam help screen for Prostate  Cancer.  Screening begins at age 34 with African-American men and at age 56 with Caucasian men.  Screening may begin sooner depending on your family history.  Take these medicines  Aspirin- One aspirin daily can help prevent Heart disease and Stroke.  Flu shot- Every fall.  Tetanus- Every 10 years.  Zostavax- Once after the age of 20 to prevent Shingles.  Pneumonia shot- Once after the age of 21; if you are younger than 17, ask your healthcare provider if you need a Pneumonia shot.  Take these steps  Don't smoke- If you do smoke, talk to your doctor about quitting.  For tips on how to quit, go to www.smokefree.gov or call 1-800-QUIT-NOW.  Be physically active- Exercise 5 days a week for at least 30 minutes.  If you are not already physically active start slow and gradually work up to 30 minutes of moderate physical activity.  Examples of moderate activity include walking briskly, mowing the yard, dancing, swimming, bicycling, etc.  Eat a healthy diet- Eat a variety of healthy food such as fruits, vegetables, low fat milk, low fat cheese, yogurt, lean meant, poultry, fish, beans, tofu, etc. For more information go to www.thenutritionsource.org  Drink alcohol in moderation- Limit alcohol intake to less than two drinks a day. Never drink and drive.  Dentist- Brush and floss twice daily; visit your dentist twice a year.  Depression- Your emotional health is as important as your physical health. If you're feeling down, or losing interest in things you would normally enjoy please talk to your healthcare provider.  Eye exam- Visit your eye doctor every year.  Safe sex- If you may be exposed to a sexually transmitted infection, use a condom.  Seat belts- Seat belts can save your life; always wear one.  Smoke/Carbon Monoxide detectors- These detectors need to be installed on the appropriate level of your home.  Replace batteries at least once a year.  Skin cancer- When out in the sun,  cover up and use sunscreen 15 SPF or higher.  Violence- If anyone is threatening you, please tell your healthcare provider.  Living Will/ Health care power of attorney- Speak with your healthcare provider and family.   IF you received an x-ray today, you will receive an invoice from [facility]. Please contact [facility] at [phone] with questions or concerns regarding your invoice.   IF you received labwork today, you will receive an invoice from [facility]. Please contact [facility] at [phone] with questions or concerns regarding your invoice.   Our billing staff will not be able to assist you with questions regarding bills from these companies.  You will be contacted with the lab results as soon as they are available. The fastest way to get your results is to activate your My Chart account. Instructions are located on the last page of this paperwork. If you have not heard from [place] regarding the results in 2 weeks, please contact this office.      I personally performed the services described in this documentation, which was scribed in my presence. The recorded information has been reviewed and considered for accuracy and completeness, addended by me as needed, and agree with information above.  Signed,   [name],  MD [facility] [place].  09/28/17 11:46 PM

## 2017-09-27 LAB — COMPREHENSIVE METABOLIC PANEL
A/G RATIO: 1.9 (ref 1.2–2.2)
ALT: 24 IU/L (ref 0–44)
AST: 21 IU/L (ref 0–40)
Albumin: 4.2 g/dL (ref 3.5–5.5)
Alkaline Phosphatase: 101 IU/L (ref 39–117)
BILIRUBIN TOTAL: 0.6 mg/dL (ref 0.0–1.2)
BUN/Creatinine Ratio: 10 (ref 9–20)
BUN: 11 mg/dL (ref 6–24)
CALCIUM: 9.2 mg/dL (ref 8.7–10.2)
CO2: 23 mmol/L (ref 20–29)
CREATININE: 1.1 mg/dL (ref 0.76–1.27)
Chloride: 101 mmol/L (ref 96–106)
GFR calc Af Amer: 86 mL/min/{1.73_m2} (ref >59)
GFR, EST NON AFRICAN AMERICAN: 75 mL/min/{1.73_m2} (ref >59)
GLOBULIN, TOTAL: 2.2 g/dL (ref 1.5–4.5)
Glucose: 93 mg/dL (ref 65–99)
Potassium: 4.4 mmol/L (ref 3.5–5.2)
SODIUM: 141 mmol/L (ref 134–144)
Total Protein: 6.4 g/dL (ref 6.0–8.5)

## 2017-09-27 LAB — LIPID PANEL
CHOLESTEROL TOTAL: 173 mg/dL (ref 100–199)
Chol/HDL Ratio: 3.6 ratio (ref 0.0–5.0)
HDL: 48 mg/dL (ref >39)
LDL CALC: 110 mg/dL — AB (ref 0–99)
TRIGLYCERIDES: 76 mg/dL (ref 0–149)
VLDL CHOLESTEROL CAL: 15 mg/dL (ref 5–40)

## 2017-09-27 LAB — TESTOSTERONE, FREE, TOTAL, SHBG
Sex Hormone Binding: 44.5 nmol/L (ref 19.3–76.4)
Testosterone, Free: 4.4 pg/mL — ABNORMAL LOW (ref 7.2–24.0)
Testosterone: 312 ng/dL (ref 264–916)

## 2017-09-28 ENCOUNTER — Encounter: Payer: Self-pay | Admitting: Family Medicine

## 2017-11-21 ENCOUNTER — Encounter: Payer: Self-pay | Admitting: Family Medicine

## 2017-12-20 ENCOUNTER — Encounter: Payer: Self-pay | Admitting: Family Medicine

## 2018-01-30 ENCOUNTER — Ambulatory Visit: Payer: 59 | Admitting: Family Medicine

## 2018-01-30 ENCOUNTER — Other Ambulatory Visit: Payer: Self-pay

## 2018-01-30 ENCOUNTER — Encounter: Payer: Self-pay | Admitting: Family Medicine

## 2018-01-30 VITALS — BP 130/88 | HR 95 | Temp 98.8°F | Resp 18 | Ht 68.11 in | Wt 222.8 lb

## 2018-01-30 DIAGNOSIS — I10 Essential (primary) hypertension: Secondary | ICD-10-CM

## 2018-01-30 DIAGNOSIS — J301 Allergic rhinitis due to pollen: Secondary | ICD-10-CM

## 2018-01-30 MED ORDER — METHYLPREDNISOLONE ACETATE 80 MG/ML IJ SUSP
80.0000 mg | Freq: Once | INTRAMUSCULAR | Status: AC
  Administered 2018-01-30: 80 mg via INTRAMUSCULAR

## 2018-01-30 MED ORDER — LEVOCETIRIZINE DIHYDROCHLORIDE 5 MG PO TABS: 5.0000 mg | ORAL_TABLET | Freq: Every evening | ORAL | 2 refills | Status: AC

## 2018-01-30 MED ORDER — LOSARTAN POTASSIUM 50 MG PO TABS: 50.0000 mg | ORAL_TABLET | Freq: Every day | ORAL | 2 refills | Status: AC

## 2018-01-30 MED ORDER — MONTELUKAST SODIUM 10 MG PO TABS: 10.0000 mg | ORAL_TABLET | Freq: Every day | ORAL | 2 refills | Status: AC

## 2018-01-30 NOTE — Patient Instructions (Addendum)
Continue Singulair once per day, xyzal once per day for allergies. You can try a different nasal spray if you would like to see if that has different relief than flonase.  No change in blood pressure meds for now. Exercise - low intensity most days per week - goal of 150 minutes.   Please bring copy of labs form biometric screening.    IF you received an x-ray today, you will receive an invoice from [facility]. Please contact [facility] at [phone] with questions or concerns regarding your invoice.   IF you received labwork today, you will receive an invoice from [facility]. Please contact [facility] at [phone] with questions or concerns regarding your invoice.   Our billing staff will not be able to assist you with questions regarding bills from these companies.  You will be contacted with the lab results as soon as they are available. The fastest way to get your results is to activate your My Chart account. Instructions are located on the last page of this paperwork. If you have not heard from [place] regarding the results in 2 weeks, please contact this office.

## 2018-01-30 NOTE — Progress Notes (Signed)
Subjective:  By signing my name below, I, Christopher Bolton, attest that this documentation has been prepared under the direction and in the presence of Christopher Ray, MD. Electronically Signed: [name], [place]. 01/30/2018 , 6:12 PM .  Patient was seen in Room 12 .   Patient ID: Christopher Bolton, male    DOB: [birth year], 57 y.o.   MRN: [id number] Chief Complaint  Patient presents with  . Medication Refill    Xyzal and Singulair would also like to know how his BP meds are    HPI Kamrin Sibley is a 57 y.o. male  Here for follow up. Last physical was in Nov 2018. He has a history of HTN and allergic rhinitis. He states he's been doing well overall.   HTN He takes Losartan 50mg  QD.   He reports BP readings at home, running about 130-135/80-88. He denies any regular exercise recently, but plans on starting up daily walking again (about 20-25 minutes each day) when weather improves. He denies chest pain, shortness of breath, lightheadedness, dizziness, dark or tarry stools.   BP Readings from Last 3 Encounters:  01/30/18 130/88  02/07/17 135/85  02/04/17 126/70   Lab Results  Component Value Date   CREATININE 1.10 09/26/2017   Lipid screening Lab Results  Component Value Date   CHOL 173 09/26/2017   HDL 48 09/26/2017   LDLCALC 110 (H) 09/26/2017   TRIG 76 09/26/2017   CHOLHDL 3.6 09/26/2017   The 10-year ASCVD risk score Christopher Bussing DC Jr., et al., 2013) is: 6.5%   Values used to calculate the score:     Age: 58 years     Sex: Male     Is Non-Hispanic African American: No     Diabetic: No     Tobacco smoker: No     Systolic Bolton Pressure: 993 mmHg     Is BP treated: Yes     HDL Cholesterol: 48 mg/dL     Total Cholesterol: 173 mg/dL   Allergic rhinitis Previously taken Flonase, Singulair and Xyzal. He was restart on Singulair once a day in Nov and Flonase. Patient states he usually start early to tackle his seasonal allergies. He takes Singulair and Xyzal once a day  about 5 months in a year. He denies side effects with his medications. He also notes Flonase works better in the beginning. He hasn't tried other nasal sprays, but will try other OTC nasal sprays first.   He requests Depo-Medrol injection for his allergic flare up due to wind. He states his allergic rhinitis usually becomes more intense in late April and early May, but recently weather has been more windy. He's had this done for 20 years now. When he works outside, he always wears a mask; although, he's still trying to find one that works for him. He would go inside and place a wet wash cloth over his eyes. He always tries to limit exposures to allergen. In the past, he had received 2 injections each year and then switched to current annual regime of early injection in March then covered with medications.   Health Maintenance Colonoscopy: due this Aug.  Prostate cancer screening: last PSA on March 2017, but has been followed urologist, Dr. Tresa Bolton, since then. Improved and went down in July 2018.   Other medications He also takes Adderall and Wellbutrin, prescribed by [name].   Patient Active Problem List   Diagnosis Date Noted  . Testosterone deficiency 01/22/2016  . Rising PSA level 01/22/2016  .  Hx of adenomatous colonic polyps 07/03/2015  . Kidney stones 04/21/2015  . DDD (degenerative disc disease), cervical 02/27/2013  . HTN (hypertension) 02/27/2013  . Allergic rhinitis 02/27/2013   Past Medical History:  Diagnosis Date  . Attention deficit disorder   . Degenerative joint disease 2012  . Dental crowns present   . Depression   . History of asthma    no current med.; is triggered by seasonal allergies  . History of diverticulitis 2016  . History of kidney stones   . Hx of adenomatous colonic polyps 06/2015  . Hypertension    states under control with med.(Intuniv); has been on med. x 3 yr.  . Limited joint range of motion    cervical spine  . Seasonal allergies   . Trigger  finger, left ring finger 06/2016   Past Surgical History:  Procedure Laterality Date  . CERVICAL FUSION  2014  . CHOLECYSTECTOMY    . COLONOSCOPY WITH PROPOFOL  06/30/2015  . CYSTOSCOPY W/ URETERAL STENT REMOVAL  10/27/2012   Procedure: CYSTOSCOPY WITH STENT REMOVAL;  Surgeon: Christopher Frock, MD;  Location: [facility];  Service: Urology;  Laterality: Left;  . CYSTOSCOPY WITH RETROGRADE PYELOGRAM, URETEROSCOPY AND STENT PLACEMENT  10/27/2012   Procedure: CYSTOSCOPY WITH RETROGRADE PYELOGRAM, URETEROSCOPY AND STENT PLACEMENT;  Surgeon: Christopher Frock, MD;  Location: [facility];  Service: Urology;  Laterality: Left;  . CYSTOSCOPY/RETROGRADE/URETEROSCOPY  10/08/2012   Procedure: CYSTOSCOPY/RETROGRADE/URETEROSCOPY;  Surgeon: Christopher Frock, MD;  Location: [facility];  Service: Urology;  Laterality: Left;  stent   . HOLMIUM LASER APPLICATION  64/40/3474   Procedure: HOLMIUM LASER APPLICATION;  Surgeon: Christopher Frock, MD;  Location: [facility];  Service: Urology;  Laterality: Left;  . PHOTOREFRACTIVE KERATOTOMY Left   . TRIGGER FINGER RELEASE Right 02/22/2011   long finger  . TRIGGER FINGER RELEASE Left 07/08/2016   Procedure: RELEASE TRIGGER FINGER/A-1 PULLEY left ring finger;  Surgeon: [name], MD;  Location: [place];  Service: Orthopedics;  Laterality: Left;  RELEASE TRIGGER FINGER/A-1 PULLEY left ring finger   Allergies  Allergen Reactions  . Apricot Flavor Other (See Comments)    ITCHING OF THROAT  . Cantaloupe (Diagnostic) Other (See Comments)    ALSO HONEYDEW MELON - ITCHING OF THROAT  . Mango Flavor Other (See Comments)    ITCHING OF THROAT  . Other Other (See Comments)    FILBERT'S NUTS - ITCHING OF THROAT  . Peach Flavor Other (See Comments)    ITCHING OF THROAT  . Watermelon Flavor [Flavoring Agent] Other (See Comments)    ITCHING OF THROAT  . Ciprofloxacin Itching   Prior to Admission medications   Medication  Sig Start Date End Date Taking? Authorizing Provider  albuterol (PROVENTIL HFA;VENTOLIN HFA) 108 (90 Base) MCG/ACT inhaler Inhale 1-2 puffs into the lungs every 6 (six) hours as needed for wheezing or shortness of breath. 01/30/17   Wendie Agreste, MD  albuterol (PROVENTIL) (2.5 MG/3ML) 0.083% nebulizer solution Take 3 mLs (2.5 mg total) by nebulization every 6 (six) hours as needed for wheezing or shortness of breath. 02/10/17   Jaynee Eagles, PA-C  amphetamine-dextroamphetamine (ADDERALL) 10 MG tablet Take 50 mg by mouth daily with breakfast.    [provider]  buPROPion (WELLBUTRIN XL) 150 MG 24 hr tablet Take 450 mg by mouth daily.    [provider]  fluticasone (FLONASE) 50 MCG/ACT nasal spray Place 2 sprays into both nostrils daily. 01/30/17   Wendie Agreste,  MD  levocetirizine (XYZAL) 5 MG tablet Take 1 tablet (5 mg total) by mouth every evening. 01/30/17   Wendie Agreste, MD  losartan (COZAAR) 50 MG tablet Take 1 tablet (50 mg total) daily by mouth. 09/26/17   Wendie Agreste, MD  montelukast (SINGULAIR) 10 MG tablet Take 1 tablet (10 mg total) by mouth at bedtime. 01/30/17   Wendie Agreste, MD  pseudoephedrine (SUDAFED) 60 MG tablet Take 1 tablet (60 mg total) by mouth every 8 (eight) hours as needed. 02/07/17   Jaynee Eagles, PA-C   Social History   Socioeconomic History  . Marital status: Married    Spouse name: Not on file  . Number of children: Not on file  . Years of education: Not on file  . Highest education level: Not on file  Occupational History  . Occupation: system Printmaker: [employer]  . Financial resource strain: Not on file  . Food insecurity:    Worry: Not on file    Inability: Not on file  . Transportation needs:    Medical: Not on file    Non-medical: Not on file  Tobacco Use  . Smoking status: Never Smoker  . Smokeless tobacco: Never Used  Substance and Sexual Activity  . Alcohol use: No     Alcohol/week: 0.0 oz    Comment: occasionally  . Drug use: No  . Sexual activity: Yes  Lifestyle  . Physical activity:    Days per week: Not on file    Minutes per session: Not on file  . Stress: Not on file  Relationships  . Social connections:    Talks on phone: Not on file    Gets together: Not on file    Attends religious service: Not on file    Active member of club or organization: Not on file    Attends meetings of clubs or organizations: Not on file    Relationship status: Not on file  . Intimate partner violence:    Fear of current or ex partner: Not on file    Emotionally abused: Not on file    Physically abused: Not on file    Forced sexual activity: Not on file  Other Topics Concern  . 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  Eyes: Negative for visual disturbance.  Respiratory: Negative for cough, chest tightness and shortness of breath.   Cardiovascular: Negative for chest pain, palpitations and leg swelling.  Gastrointestinal: Negative for abdominal pain and Bolton in stool.  Allergic/Immunologic: Positive for environmental allergies.  Neurological: Negative for dizziness, light-headedness and headaches.       Objective:   Physical Exam  Constitutional: He is oriented to person, place, and time. He appears well-developed and well-nourished.  HENT:  Head: Normocephalic and atraumatic.  Nose: Mucosal edema present.  Mouth/Throat: Mucous membranes are normal.  Slight infraorbital edema  Eyes: Pupils are equal, round, and reactive to light. EOM are normal.  Neck: No JVD present. Carotid bruit is not present.  Cardiovascular: Normal rate, regular rhythm and normal heart sounds.  No murmur heard. Pulmonary/Chest: Effort normal and breath sounds  normal. He has no rales.  Musculoskeletal: He exhibits no edema.  Neurological: He is alert and oriented to person, place, and  time.  Skin: Skin is warm and dry.  Psychiatric: He has a normal mood and affect.  Vitals reviewed.   Vitals:   01/30/18 1722  BP: 130/88  Pulse: 95  Resp: 18  Temp: 98.8 F (37.1 C)  TempSrc: Oral  SpO2: 96%  Weight: 222 lb 12.8 oz (101.1 kg)  Height: 5' 8.11" (1.73 m)       Assessment & Plan:   Christopher Bolton is a 57 y.o. male Essential hypertension- Plan: losartan (COZAAR) 50 MG tablet  -Tolerating current dose of losartan, Bolton pressure overall controlled.  No change in doses.  Plans on upcoming biometric screening, labs were deferred as he can bring a copy of those tests to review  Seasonal allergic rhinitis due to pollen - Plan: montelukast (SINGULAIR) 10 MG tablet, levocetirizine (XYZAL) 5 MG tablet, methylPREDNISolone acetate (DEPO-MEDROL) injection 80 mg  -Some increasing symptoms, similar to previous years where he is required Depo-Medrol for control.  Agreed on Depo-Medrol injection again, continue Xyzal, Singulair and steroid nasal sprays discussed.  RTC precautions given  Meds ordered this encounter  Medications  . montelukast (SINGULAIR) 10 MG tablet    Sig: Take 1 tablet (10 mg total) by mouth at bedtime.    Dispense:  90 tablet    Refill:  2  . levocetirizine (XYZAL) 5 MG tablet    Sig: Take 1 tablet (5 mg total) by mouth every evening.    Dispense:  90 tablet    Refill:  2  . methylPREDNISolone acetate (DEPO-MEDROL) injection 80 mg  . losartan (COZAAR) 50 MG tablet    Sig: Take 1 tablet (50 mg total) by mouth daily.    Dispense:  90 tablet    Refill:  2   Patient Instructions   Continue Singulair once per day, xyzal once per day for allergies. You can try a different nasal spray if you would like to see if that has different relief than flonase.  No change in Bolton pressure meds for now. Exercise - low intensity most days  per week - goal of 150 minutes.   Please bring copy of labs form biometric screening.    IF you received an x-Bolton today, you will receive an invoice from [facility]. Please contact [facility] at [phone] with questions or concerns regarding your invoice.   IF you received labwork today, you will receive an invoice from [facility]. Please contact [facility] at [phone] with questions or concerns regarding your invoice.   Our billing staff will not be able to assist you with questions regarding bills from these companies.  You will be contacted with the lab results as soon as they are available. The fastest way to get your results is to activate your My Chart account. Instructions are located on the last page of this paperwork. If you have not heard from [place] regarding the results in 2 weeks, please contact this office.      I personally performed the services described in this documentation, which was scribed in my presence. The recorded information has been reviewed and considered for accuracy and completeness, addended by me as needed, and agree with information above.  Signed,   Christopher Ray, MD [facility] [place].  02/01/18 9:54 PM

## 2018-02-01 ENCOUNTER — Encounter: Payer: Self-pay | Admitting: Family Medicine

## 2018-02-02 DIAGNOSIS — Z630 Problems in relationship with spouse or partner: Secondary | ICD-10-CM | POA: Diagnosis not present

## 2018-02-09 DIAGNOSIS — Z630 Problems in relationship with spouse or partner: Secondary | ICD-10-CM | POA: Diagnosis not present

## 2018-02-12 ENCOUNTER — Encounter: Payer: Self-pay | Admitting: Family Medicine

## 2018-02-13 ENCOUNTER — Encounter: Payer: Self-pay | Admitting: Family Medicine

## 2018-02-13 ENCOUNTER — Ambulatory Visit: Payer: 59 | Admitting: Family Medicine

## 2018-02-13 VITALS — BP 151/96 | HR 77 | Temp 97.5°F | Ht 68.0 in | Wt 220.6 lb

## 2018-02-13 DIAGNOSIS — Z8719 Personal history of other diseases of the digestive system: Secondary | ICD-10-CM

## 2018-02-13 DIAGNOSIS — R1032 Left lower quadrant pain: Secondary | ICD-10-CM | POA: Diagnosis not present

## 2018-02-13 DIAGNOSIS — R1012 Left upper quadrant pain: Secondary | ICD-10-CM

## 2018-02-13 DIAGNOSIS — R102 Pelvic and perineal pain: Secondary | ICD-10-CM | POA: Diagnosis not present

## 2018-02-13 LAB — POCT URINALYSIS DIP (MANUAL ENTRY)
BILIRUBIN UA: NEGATIVE mg/dL
Bilirubin, UA: NEGATIVE
Blood, UA: NEGATIVE
GLUCOSE UA: NEGATIVE mg/dL
Nitrite, UA: NEGATIVE
PROTEIN UA: NEGATIVE mg/dL
SPEC GRAV UA: 1.015 (ref 1.010–1.025)
Urobilinogen, UA: 0.2 E.U./dL
pH, UA: 6.5 (ref 5.0–8.0)

## 2018-02-13 LAB — POCT CBC
GRANULOCYTE PERCENT: 59.4 % (ref 37–80)
HCT, POC: 49.8 % (ref 43.5–53.7)
Hemoglobin: 16.1 g/dL (ref 14.1–18.1)
LYMPH, POC: 2 (ref 0.6–3.4)
MCH, POC: 29.1 pg (ref 27–31.2)
MCHC: 32.3 g/dL (ref 31.8–35.4)
MCV: 90 fL (ref 80–97)
MID (CBC): 0.5 (ref 0–0.9)
MPV: 7.9 fL (ref 0–99.8)
POC GRANULOCYTE: 3.7 (ref 2–6.9)
POC LYMPH %: 32.6 % (ref 10–50)
POC MID %: 8 %M (ref 0–12)
Platelet Count, POC: 264 10*3/uL (ref 142–424)
RBC: 5.53 M/uL (ref 4.69–6.13)
RDW, POC: 13 %
WBC: 6.2 10*3/uL (ref 4.6–10.2)

## 2018-02-13 LAB — POC MICROSCOPIC URINALYSIS (UMFC): MUCUS RE: ABSENT

## 2018-02-13 MED ORDER — AMOXICILLIN-POT CLAVULANATE 875-125 MG PO TABS: 1.0000 | ORAL_TABLET | Freq: Two times a day (BID) | ORAL | 0 refills | Status: DC

## 2018-02-13 MED ORDER — TRAMADOL HCL 50 MG PO TABS: 50.0000 mg | ORAL_TABLET | Freq: Three times a day (TID) | ORAL | 0 refills | Status: AC | PRN

## 2018-02-13 NOTE — Progress Notes (Signed)
Subjective:  By signing my name below, I, Christopher Bolton, attest that this documentation has been prepared under the direction and in the presence of Christopher Agreste, MD Electronically Signed: Ladene Bolton, ED Scribe 02/13/2018 at 11:58 AM.   Patient ID: Christopher Bolton, male    DOB: 25-Nov-1960, 57 y.o.   MRN: [id number]  Chief Complaint  Patient presents with  . Abdominal Pain    (diverticulitist Flare up?) started to hurt saturday aftenoon   HPI [facility] "Christopher Bolton" is a 58 y.o. male who presents to Primary Care at [facility] complaining of lower abdominal pain. See pt e-mail. H/o diverticulitis in June 2016. Colonoscopy 06/30/15, diverticulosis noted with 4 adenomas/polyps removed. Treated with Augmentin, allergic to Cipro. Rpt colonoscopy in 2019.  Pt states that he started feeling "off" 3-4 days ago but noticed mid to LLQ abdominal pain onset 2 days ago. Pt reports more pain yesterday after walking 2 miles, rating symptoms 4/10, currently rating symptoms 2/10. He has noticed decreased appetite over the past few days as well. Pt states abdominal pain feels similar to flare up in 2016 which improved with Augmentin. Last BM was this morning, normal, but states that he has a "constant sensation to have a BM". Denies fever, nausea, vomiting, blood in stools, cough, urinary hesitancy, dysuria, hematuria. Reports multiple family members with diverticulitis as well.  Patient Active Problem List   Diagnosis Date Noted  . Testosterone deficiency 01/22/2016  . Rising PSA level 01/22/2016  . Hx of adenomatous colonic polyps 07/03/2015  . Kidney stones 04/21/2015  . DDD (degenerative disc disease), cervical 02/27/2013  . HTN (hypertension) 02/27/2013  . Allergic rhinitis 02/27/2013   Past Medical History:  Diagnosis Date  . Attention deficit disorder   . Degenerative joint disease 2012  . Dental crowns present   . Depression   . History of asthma    no current med.; is triggered by seasonal  allergies  . History of diverticulitis 2016  . History of kidney stones   . Hx of adenomatous colonic polyps 06/2015  . Hypertension    states under control with med.(Intuniv); has been on med. x 3 yr.  . Limited joint range of motion    cervical spine  . Seasonal allergies   . Trigger finger, left ring finger 06/2016   Past Surgical History:  Procedure Laterality Date  . CERVICAL FUSION  2014  . CHOLECYSTECTOMY    . COLONOSCOPY WITH PROPOFOL  06/30/2015  . CYSTOSCOPY W/ URETERAL STENT REMOVAL  10/27/2012   Procedure: CYSTOSCOPY WITH STENT REMOVAL;  Surgeon: [name], MD;  Location: [facility];  Service: Urology;  Laterality: Left;  . CYSTOSCOPY WITH RETROGRADE PYELOGRAM, URETEROSCOPY AND STENT PLACEMENT  10/27/2012   Procedure: CYSTOSCOPY WITH RETROGRADE PYELOGRAM, URETEROSCOPY AND STENT PLACEMENT;  Surgeon: [name], MD;  Location: [facility];  Service: Urology;  Laterality: Left;  . CYSTOSCOPY/RETROGRADE/URETEROSCOPY  10/08/2012   Procedure: CYSTOSCOPY/RETROGRADE/URETEROSCOPY;  Surgeon: [name], MD;  Location: [facility];  Service: Urology;  Laterality: Left;  stent   . HOLMIUM LASER APPLICATION  62/83/1517   Procedure: HOLMIUM LASER APPLICATION;  Surgeon: [name], MD;  Location: [facility];  Service: Urology;  Laterality: Left;  . PHOTOREFRACTIVE KERATOTOMY Left   . TRIGGER FINGER RELEASE Right 02/22/2011   long finger  . TRIGGER FINGER RELEASE Left 07/08/2016   Procedure: RELEASE TRIGGER FINGER/A-1 PULLEY left ring finger;  Surgeon: [name], MD;  Location: [place];  Service: Orthopedics;  Laterality: Left;  RELEASE TRIGGER FINGER/A-1 PULLEY left ring finger   Allergies  Allergen Reactions  . Apricot Flavor Other (See Comments)    ITCHING OF THROAT  . Cantaloupe (Diagnostic) Other (See Comments)    ALSO HONEYDEW MELON - ITCHING OF THROAT  . Mango Flavor Other (See Comments)    ITCHING  OF THROAT  . Other Other (See Comments)    FILBERT'S NUTS - ITCHING OF THROAT  . Peach Flavor Other (See Comments)    ITCHING OF THROAT  . Watermelon Flavor [Flavoring Agent] Other (See Comments)    ITCHING OF THROAT  . Ciprofloxacin Itching   Prior to Admission medications   Medication Sig Start Date End Date Taking? Authorizing Provider  albuterol (PROVENTIL HFA;VENTOLIN HFA) 108 (90 Base) MCG/ACT inhaler Inhale 1-2 puffs into the lungs every 6 (six) hours as needed for wheezing or shortness of breath. 01/30/17   Christopher Agreste, MD  albuterol (PROVENTIL) (2.5 MG/3ML) 0.083% nebulizer solution Take 3 mLs (2.5 mg total) by nebulization every 6 (six) hours as needed for wheezing or shortness of breath. 02/10/17   Jaynee Eagles, PA-C  amphetamine-dextroamphetamine (ADDERALL) 10 MG tablet Take 50 mg by mouth daily with breakfast.    [provider]  buPROPion (WELLBUTRIN XL) 150 MG 24 hr tablet Take 450 mg by mouth daily.    [provider]  fluticasone (FLONASE) 50 MCG/ACT nasal spray Place 2 sprays into both nostrils daily. 01/30/17   Christopher Agreste, MD  levocetirizine (XYZAL) 5 MG tablet Take 1 tablet (5 mg total) by mouth every evening. 01/30/18   Christopher Agreste, MD  losartan (COZAAR) 50 MG tablet Take 1 tablet (50 mg total) by mouth daily. 01/30/18   Christopher Agreste, MD  montelukast (SINGULAIR) 10 MG tablet Take 1 tablet (10 mg total) by mouth at bedtime. 01/30/18   Christopher Agreste, MD  pseudoephedrine (SUDAFED) 60 MG tablet Take 1 tablet (60 mg total) by mouth every 8 (eight) hours as needed. 02/07/17   Jaynee Eagles, PA-C   Social History   Socioeconomic History  . Marital status: Married    Spouse name: Not on file  . Number of children: Not on file  . Years of education: Not on file  . Highest education level: Not on file  Occupational History  . Occupation: system Printmaker: [employer]  . Financial resource strain: Not on file    . Food insecurity:    Worry: Not on file    Inability: Not on file  . Transportation needs:    Medical: Not on file    Non-medical: Not on file  Tobacco Use  . Smoking status: Never Smoker  . Smokeless tobacco: Never Used  Substance and Sexual Activity  . Alcohol use: No    Alcohol/week: 0.0 oz    Comment: occasionally  . Drug use: No  . Sexual activity: Yes  Lifestyle  . Physical activity:    Days per week: Not on file    Minutes per session: Not on file  . Stress: Not on file  Relationships  . Social connections:    Talks on phone: Not on file    Gets together: Not on file    Attends religious service: Not on file    Active member of club or organization: Not on file    Attends meetings of clubs or organizations: Not on file    Relationship status: Not on file  . Intimate  partner violence:    Fear of current or ex partner: Not on file    Emotionally abused: Not on file    Physically abused: Not on file    Forced sexual activity: Not on file  Other Topics Concern  . 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 Negative for fever.  Respiratory: Negative for cough.   Gastrointestinal: Positive for abdominal pain. Negative for blood in stool, nausea and vomiting.  Genitourinary: Negative for difficulty urinating, dysuria and hematuria.      Objective:   Physical Exam  Constitutional: He is oriented to person, place, and time. He appears well-developed and well-nourished. No distress.  HENT:  Head: Normocephalic and atraumatic.  Eyes: Conjunctivae and EOM are normal.  Neck: Neck supple. No tracheal deviation present.  Cardiovascular: Normal rate, regular rhythm and normal heart sounds. Exam reveals no gallop and no friction rub.  No murmur heard. Pulmonary/Chest: Effort normal  and breath sounds normal. No respiratory distress.  Abdominal: There is tenderness in the suprapubic area, left upper quadrant and left lower quadrant. There is no CVA tenderness, no tenderness at McBurney's point and negative Murphy's sign.  Slightly hyperactive bowel sounds. Minimal LUQ tenderness. Most tender over suprapubic and LLQ.  Musculoskeletal: Normal range of motion.  Neurological: He is alert and oriented to person, place, and time.  Skin: Skin is warm and dry.  Psychiatric: He has a normal mood and affect. His behavior is normal.  Nursing note and vitals reviewed.  Vitals:   02/13/18 1113  BP: (!) 151/96  Pulse: 77  Temp: (!) 97.5 F (36.4 C)  TempSrc: Oral  SpO2: 98%  Weight: 220 lb 9.6 oz (100.1 kg)  Height: 5\' 8"  (1.727 m)   Results for orders placed or performed in visit on 02/13/18  POCT CBC  Result Value Ref Range   WBC 6.2 4.6 - 10.2 K/uL   Lymph, poc 2.0 0.6 - 3.4   POC LYMPH PERCENT 32.6 10 - 50 %L   MID (cbc) 0.5 0 - 0.9   POC MID % 8.0 0 - 12 %M   POC Granulocyte 3.7 2 - 6.9   Granulocyte percent 59.4 37 - 80 %G   RBC 5.53 4.69 - 6.13 M/uL   Hemoglobin 16.1 14.1 - 18.1 g/dL   HCT, POC 49.8 43.5 - 53.7 %   MCV 90.0 80 - 97 fL   MCH, POC 29.1 27 - 31.2 pg   MCHC 32.3 31.8 - 35.4 g/dL   RDW, POC 13.0 %   Platelet Count, POC 264 142 - 424 K/uL   MPV 7.9 0 - 99.8 fL  POCT urinalysis dipstick  Result Value Ref Range   Color, UA yellow yellow   Clarity, UA clear clear   Glucose, UA negative negative mg/dL   Bilirubin, UA negative negative   Ketones, POC UA negative negative mg/dL   Spec Grav, UA 1.015 1.010 - 1.025   Blood, UA negative negative   pH, UA 6.5 5.0 - 8.0   Protein Ur, POC negative negative mg/dL   Urobilinogen, UA 0.2 0.2 or 1.0 E.U./dL   Nitrite, UA Negative Negative   Leukocytes, UA Small (1+) (A) Negative  POCT Microscopic Urinalysis ([facility])  Result Value Ref Range   WBC,UR,HPF,POC Few (A) None WBC/hpf   RBC,UR,HPF,POC None  None  RBC/hpf   Bacteria None None, Too numerous to count   Mucus Absent Absent   Epithelial Cells, UR Per Microscopy None None, Too numerous to count cells/hpf      Assessment & Plan:    Christopher Bolton is a 57 y.o. male LLQ abdominal pain - Plan: Urine Culture, PSA, amoxicillin-clavulanate (AUGMENTIN) 875-125 MG tablet, traMADol (ULTRAM) 50 MG tablet  Abdominal pain, left lower quadrant - Plan: POCT CBC, POCT urinalysis dipstick, POCT Microscopic Urinalysis ([facility])  History of diverticulitis - Plan: amoxicillin-clavulanate (AUGMENTIN) 875-125 MG tablet  Suprapubic abdominal pain - Plan: PSA, amoxicillin-clavulanate (AUGMENTIN) 875-125 MG tablet, traMADol (ULTRAM) 50 MG tablet  LUQ abdominal pain - Plan: Lipase  Acute onset of lower abdominal pain, left lower quadrant most tender.  Suspected diverticulitis with history of same years ago.  Few white blood cells noted on urinalysis, but no other urinary symptoms. Overall pain is less today than yesterday.  -Check urine culture, PSA, lipase, but less likely because of infection.  -Decided to initially treat for diverticulitis with Augmentin as allergic to Cipro, improved previously on that treatment.  Recheck in 48-72 hours if not improving, sooner if worse for CT scan.  ER/RTC precautions.  -Will be due for follow-up with gastroenterology this year for repeat colonoscopy, can decide on timing after current treatment   Meds ordered this encounter  Medications  . amoxicillin-clavulanate (AUGMENTIN) 875-125 MG tablet    Sig: Take 1 tablet by mouth 2 (two) times daily.    Dispense:  20 tablet    Refill:  0  . traMADol (ULTRAM) 50 MG tablet    Sig: Take 1 tablet (50 mg total) by mouth every 8 (eight) hours as needed.    Dispense:  15 tablet    Refill:  0   Patient Instructions    Based on your exam, symptoms I am suspicious of repeat diverticulitis.  Start Augmentin 1 pill twice per day.  Bland foods, and fluids are most important  initially.  Tramadol if needed for more severe pain.  I am checking some other blood work including pancreas test, prostate test, and urine culture, but less likely causes.  Recheck in the next 48-72 hours if not improving to decide on CAT scan, sooner if worse.     Abdominal Pain, Adult Abdominal pain can be caused by many things. Often, abdominal pain is not serious and it gets better with no treatment or by being treated at home. However, sometimes abdominal pain is serious. Your health care provider will do a medical history and a physical exam to try to determine the cause of your abdominal pain. Follow these instructions at home:  Take over-the-counter and prescription medicines only as told by your health care provider. Do not take a laxative unless told by your health care provider.  Drink enough fluid to keep your urine clear or pale yellow.  Watch your condition for any changes.  Keep all follow-up visits as told by your health care provider. This is important. Contact a health care provider if:  Your abdominal pain changes or gets worse.  You are not hungry or you lose weight without trying.  You are constipated or have diarrhea for more than 2-3 days.  You have pain when you urinate or have a bowel movement.  Your abdominal pain wakes you up at night.  Your pain gets worse with meals, after eating, or with certain foods.  You are throwing up and cannot keep anything down.  You have  a fever. Get help right away if:  Your pain does not go away as soon as your health care provider told you to expect.  You cannot stop throwing up.  Your pain is only in areas of the abdomen, such as the right side or the left lower portion of the abdomen.  You have bloody or black stools, or stools that look like tar.  You have severe pain, cramping, or bloating in your abdomen.  You have signs of dehydration, such as: ? Dark urine, very little urine, or no urine. ? Cracked  lips. ? Dry mouth. ? Sunken eyes. ? Sleepiness. ? Weakness. This information is not intended to replace advice given to you by your health care provider. Make sure you discuss any questions you have with your health care provider. Document Released: 08/04/2005 Document Revised: 05/14/2016 Document Reviewed: 04/07/2016 Elsevier Interactive Patient Education  2018 [employer].  Diverticulitis Diverticulitis is infection or inflammation of small pouches (diverticula) in the colon that form due to a condition called diverticulosis. Diverticula can trap stool (feces) and bacteria, causing infection and inflammation. Diverticulitis may cause severe stomach pain and diarrhea. It may lead to tissue damage in the colon that causes bleeding. The diverticula may also burst (rupture) and cause infected stool to enter other areas of the abdomen. Complications of diverticulitis can include:  Bleeding.  Severe infection.  Severe pain.  Rupture (perforation) of the colon.  Blockage (obstruction) of the colon.  What are the causes? This condition is caused by stool becoming trapped in the diverticula, which allows bacteria to grow in the diverticula. This leads to inflammation and infection. What increases the risk? You are more likely to develop this condition if:  You have diverticulosis. The risk for diverticulosis increases if: ? You are overweight or obese. ? You use tobacco products. ? You do not get enough exercise.  You eat a diet that does not include enough fiber. High-fiber foods include fruits, vegetables, beans, nuts, and whole grains.  What are the signs or symptoms? Symptoms of this condition may include:  Pain and tenderness in the abdomen. The pain is normally located on the left side of the abdomen, but it may occur in other areas.  Fever and chills.  Bloating.  Cramping.  Nausea.  Vomiting.  Changes in bowel routines.  Blood in your stool.  How is this  diagnosed? This condition is diagnosed based on:  Your medical history.  A physical exam.  Tests to make sure there is nothing else causing your condition. These tests may include: ? Blood tests. ? Urine tests. ? Imaging tests of the abdomen, including X-rays, ultrasounds, MRIs, or CT scans.  How is this treated? Most cases of this condition are mild and can be treated at home. Treatment may include:  Taking over-the-counter pain medicines.  Following a clear liquid diet.  Taking antibiotic medicines by mouth.  Rest.  More severe cases may need to be treated at a hospital. Treatment may include:  Not eating or drinking.  Taking prescription pain medicine.  Receiving antibiotic medicines through an IV tube.  Receiving fluids and nutrition through an IV tube.  Surgery.  When your condition is under control, your health care provider may recommend that you have a colonoscopy. This is an exam to look at the entire large intestine. During the exam, a lubricated, bendable tube is inserted into the anus and then passed into the rectum, colon, and other parts of the large intestine. A colonoscopy can  show how severe your diverticula are and whether something else may be causing your symptoms. Follow these instructions at home: Medicines  Take over-the-counter and prescription medicines only as told by your health care provider. These include fiber supplements, probiotics, and stool softeners.  If you were prescribed an antibiotic medicine, take it as told by your health care provider. Do not stop taking the antibiotic even if you start to feel better.  Do not drive or use heavy machinery while taking prescription pain medicine. General instructions  Follow a full liquid diet or another diet as directed by your health care provider. After your symptoms improve, your health care provider may tell you to change your diet. He or she may recommend that you eat a diet that contains at  least 25 g (25 grams) of fiber daily. Fiber makes it easier to pass stool. Healthy sources of fiber include: ? Berries. One cup contains 4-8 grams of fiber. ? Beans or lentils. One half cup contains 5-8 grams of fiber. ? Green vegetables. One cup contains 4 grams of fiber.  Exercise for at least 30 minutes, 3 times each week. You should exercise hard enough to raise your heart rate and break a sweat.  Keep all follow-up visits as told by your health care provider. This is important. You may need a colonoscopy. Contact a health care provider if:  Your pain does not improve.  You have a hard time drinking or eating food.  Your bowel movements do not return to normal. Get help right away if:  Your pain gets worse.  Your symptoms do not get better with treatment.  Your symptoms suddenly get worse.  You have a fever.  You vomit more than one time.  You have stools that are bloody, black, or tarry. Summary  Diverticulitis is infection or inflammation of small pouches (diverticula) in the colon that form due to a condition called diverticulosis. Diverticula can trap stool (feces) and bacteria, causing infection and inflammation.  You are at higher risk for this condition if you have diverticulosis and you eat a diet that does not include enough fiber.  Most cases of this condition are mild and can be treated at home. More severe cases may need to be treated at a hospital.  When your condition is under control, your health care provider may recommend that you have an exam called a colonoscopy. This exam can show how severe your diverticula are and whether something else may be causing your symptoms. This information is not intended to replace advice given to you by your health care provider. Make sure you discuss any questions you have with your health care provider. Document Released: 08/04/2005 Document Revised: 11/27/2016 Document Reviewed: 11/27/2016 Elsevier Interactive Patient  Education  2018 [employer].   IF you received an x-ray today, you will receive an invoice from [facility]. Please contact [facility] at [phone] with questions or concerns regarding your invoice.   IF you received labwork today, you will receive an invoice from [facility]. Please contact [facility] at [phone] with questions or concerns regarding your invoice.   Our billing staff will not be able to assist you with questions regarding bills from these companies.  You will be contacted with the lab results as soon as they are available. The fastest way to get your results is to activate your My Chart account. Instructions are located on the last page of this paperwork. If you have not heard from [place] regarding the results in 2  weeks, please contact this office.       I personally performed the services described in this documentation, which was scribed in my presence. The recorded information has been reviewed and considered for accuracy and completeness, addended by me as needed, and agree with information above.  Signed,   [name], MD [facility] at [place].  02/13/18 12:20 PM

## 2018-02-13 NOTE — Patient Instructions (Addendum)
Based on your exam, symptoms I am suspicious of repeat diverticulitis.  Start Augmentin 1 pill twice per day.  Bland foods, and fluids are most important initially.  Tramadol if needed for more severe pain.  I am checking some other blood work including pancreas test, prostate test, and urine culture, but less likely causes.  Recheck in the next 48-72 hours if not improving to decide on CAT scan, sooner if worse.     Abdominal Pain, Adult Abdominal pain can be caused by many things. Often, abdominal pain is not serious and it gets better with no treatment or by being treated at home. However, sometimes abdominal pain is serious. Your health care provider will do a medical history and a physical exam to try to determine the cause of your abdominal pain. Follow these instructions at home:  Take over-the-counter and prescription medicines only as told by your health care provider. Do not take a laxative unless told by your health care provider.  Drink enough fluid to keep your urine clear or pale yellow.  Watch your condition for any changes.  Keep all follow-up visits as told by your health care provider. This is important. Contact a health care provider if:  Your abdominal pain changes or gets worse.  You are not hungry or you lose weight without trying.  You are constipated or have diarrhea for more than 2-3 days.  You have pain when you urinate or have a bowel movement.  Your abdominal pain wakes you up at night.  Your pain gets worse with meals, after eating, or with certain foods.  You are throwing up and cannot keep anything down.  You have a fever. Get help right away if:  Your pain does not go away as soon as your health care provider told you to expect.  You cannot stop throwing up.  Your pain is only in areas of the abdomen, such as the right side or the left lower portion of the abdomen.  You have bloody or black stools, or stools that look like tar.  You have  severe pain, cramping, or bloating in your abdomen.  You have signs of dehydration, such as: ? Dark urine, very little urine, or no urine. ? Cracked lips. ? Dry mouth. ? Sunken eyes. ? Sleepiness. ? Weakness. This information is not intended to replace advice given to you by your health care provider. Make sure you discuss any questions you have with your health care provider. Document Released: 08/04/2005 Document Revised: 05/14/2016 Document Reviewed: 04/07/2016 Elsevier Interactive Patient Education  2018 [employer].  Diverticulitis Diverticulitis is infection or inflammation of small pouches (diverticula) in the colon that form due to a condition called diverticulosis. Diverticula can trap stool (feces) and bacteria, causing infection and inflammation. Diverticulitis may cause severe stomach pain and diarrhea. It may lead to tissue damage in the colon that causes bleeding. The diverticula may also burst (rupture) and cause infected stool to enter other areas of the abdomen. Complications of diverticulitis can include:  Bleeding.  Severe infection.  Severe pain.  Rupture (perforation) of the colon.  Blockage (obstruction) of the colon.  What are the causes? This condition is caused by stool becoming trapped in the diverticula, which allows bacteria to grow in the diverticula. This leads to inflammation and infection. What increases the risk? You are more likely to develop this condition if:  You have diverticulosis. The risk for diverticulosis increases if: ? You are overweight or obese. ? You use tobacco products. ?  You do not get enough exercise.  You eat a diet that does not include enough fiber. High-fiber foods include fruits, vegetables, beans, nuts, and whole grains.  What are the signs or symptoms? Symptoms of this condition may include:  Pain and tenderness in the abdomen. The pain is normally located on the left side of the abdomen, but it may occur in other  areas.  Fever and chills.  Bloating.  Cramping.  Nausea.  Vomiting.  Changes in bowel routines.  Blood in your stool.  How is this diagnosed? This condition is diagnosed based on:  Your medical history.  A physical exam.  Tests to make sure there is nothing else causing your condition. These tests may include: ? Blood tests. ? Urine tests. ? Imaging tests of the abdomen, including X-rays, ultrasounds, MRIs, or CT scans.  How is this treated? Most cases of this condition are mild and can be treated at home. Treatment may include:  Taking over-the-counter pain medicines.  Following a clear liquid diet.  Taking antibiotic medicines by mouth.  Rest.  More severe cases may need to be treated at a hospital. Treatment may include:  Not eating or drinking.  Taking prescription pain medicine.  Receiving antibiotic medicines through an IV tube.  Receiving fluids and nutrition through an IV tube.  Surgery.  When your condition is under control, your health care provider may recommend that you have a colonoscopy. This is an exam to look at the entire large intestine. During the exam, a lubricated, bendable tube is inserted into the anus and then passed into the rectum, colon, and other parts of the large intestine. A colonoscopy can show how severe your diverticula are and whether something else may be causing your symptoms. Follow these instructions at home: Medicines  Take over-the-counter and prescription medicines only as told by your health care provider. These include fiber supplements, probiotics, and stool softeners.  If you were prescribed an antibiotic medicine, take it as told by your health care provider. Do not stop taking the antibiotic even if you start to feel better.  Do not drive or use heavy machinery while taking prescription pain medicine. General instructions  Follow a full liquid diet or another diet as directed by your health care provider.  After your symptoms improve, your health care provider may tell you to change your diet. He or she may recommend that you eat a diet that contains at least 25 g (25 grams) of fiber daily. Fiber makes it easier to pass stool. Healthy sources of fiber include: ? Berries. One cup contains 4-8 grams of fiber. ? Beans or lentils. One half cup contains 5-8 grams of fiber. ? Green vegetables. One cup contains 4 grams of fiber.  Exercise for at least 30 minutes, 3 times each week. You should exercise hard enough to raise your heart rate and break a sweat.  Keep all follow-up visits as told by your health care provider. This is important. You may need a colonoscopy. Contact a health care provider if:  Your pain does not improve.  You have a hard time drinking or eating food.  Your bowel movements do not return to normal. Get help right away if:  Your pain gets worse.  Your symptoms do not get better with treatment.  Your symptoms suddenly get worse.  You have a fever.  You vomit more than one time.  You have stools that are bloody, black, or tarry. Summary  Diverticulitis is infection or inflammation of  small pouches (diverticula) in the colon that form due to a condition called diverticulosis. Diverticula can trap stool (feces) and bacteria, causing infection and inflammation.  You are at higher risk for this condition if you have diverticulosis and you eat a diet that does not include enough fiber.  Most cases of this condition are mild and can be treated at home. More severe cases may need to be treated at a hospital.  When your condition is under control, your health care provider may recommend that you have an exam called a colonoscopy. This exam can show how severe your diverticula are and whether something else may be causing your symptoms. This information is not intended to replace advice given to you by your health care provider. Make sure you discuss any questions you have with  your health care provider. Document Released: 08/04/2005 Document Revised: 11/27/2016 Document Reviewed: 11/27/2016 Elsevier Interactive Patient Education  2018 [employer].   IF you received an x-ray today, you will receive an invoice from [facility]. Please contact [facility] at [phone] with questions or concerns regarding your invoice.   IF you received labwork today, you will receive an invoice from [facility]. Please contact [facility] at [phone] with questions or concerns regarding your invoice.   Our billing staff will not be able to assist you with questions regarding bills from these companies.  You will be contacted with the lab results as soon as they are available. The fastest way to get your results is to activate your My Chart account. Instructions are located on the last page of this paperwork. If you have not heard from [place] regarding the results in 2 weeks, please contact this office.

## 2018-02-14 LAB — LIPASE: LIPASE: 18 U/L (ref 13–78)

## 2018-02-14 LAB — PSA: PROSTATE SPECIFIC AG, SERUM: 3.4 ng/mL (ref 0.0–4.0)

## 2018-02-14 LAB — URINE CULTURE

## 2018-02-16 ENCOUNTER — Ambulatory Visit: Payer: 59 | Admitting: Family Medicine

## 2018-03-02 DIAGNOSIS — Z630 Problems in relationship with spouse or partner: Secondary | ICD-10-CM | POA: Diagnosis not present

## 2018-04-06 DIAGNOSIS — Z630 Problems in relationship with spouse or partner: Secondary | ICD-10-CM | POA: Diagnosis not present

## 2018-04-20 DIAGNOSIS — Z630 Problems in relationship with spouse or partner: Secondary | ICD-10-CM | POA: Diagnosis not present

## 2018-05-04 DIAGNOSIS — Z630 Problems in relationship with spouse or partner: Secondary | ICD-10-CM | POA: Diagnosis not present

## 2018-05-25 DIAGNOSIS — Z630 Problems in relationship with spouse or partner: Secondary | ICD-10-CM | POA: Diagnosis not present

## 2018-05-29 DIAGNOSIS — R972 Elevated prostate specific antigen [PSA]: Secondary | ICD-10-CM | POA: Diagnosis not present

## 2018-06-05 DIAGNOSIS — N200 Calculus of kidney: Secondary | ICD-10-CM | POA: Diagnosis not present

## 2018-06-05 DIAGNOSIS — R972 Elevated prostate specific antigen [PSA]: Secondary | ICD-10-CM | POA: Diagnosis not present

## 2018-06-22 DIAGNOSIS — Z630 Problems in relationship with spouse or partner: Secondary | ICD-10-CM | POA: Diagnosis not present

## 2018-07-06 DIAGNOSIS — Z630 Problems in relationship with spouse or partner: Secondary | ICD-10-CM | POA: Diagnosis not present

## 2018-08-03 DIAGNOSIS — Z630 Problems in relationship with spouse or partner: Secondary | ICD-10-CM | POA: Diagnosis not present

## 2018-08-18 DIAGNOSIS — Z630 Problems in relationship with spouse or partner: Secondary | ICD-10-CM | POA: Diagnosis not present

## 2018-08-25 DIAGNOSIS — R972 Elevated prostate specific antigen [PSA]: Secondary | ICD-10-CM | POA: Diagnosis not present

## 2018-08-25 DIAGNOSIS — I10 Essential (primary) hypertension: Secondary | ICD-10-CM | POA: Diagnosis not present

## 2018-08-25 DIAGNOSIS — M5136 Other intervertebral disc degeneration, lumbar region: Secondary | ICD-10-CM | POA: Diagnosis not present

## 2018-09-11 DIAGNOSIS — Z125 Encounter for screening for malignant neoplasm of prostate: Secondary | ICD-10-CM | POA: Diagnosis not present

## 2018-09-11 DIAGNOSIS — I10 Essential (primary) hypertension: Secondary | ICD-10-CM | POA: Diagnosis not present

## 2018-09-11 DIAGNOSIS — Z0000 Encounter for general adult medical examination without abnormal findings: Secondary | ICD-10-CM | POA: Diagnosis not present

## 2018-09-14 DIAGNOSIS — Z630 Problems in relationship with spouse or partner: Secondary | ICD-10-CM | POA: Diagnosis not present

## 2018-09-28 DIAGNOSIS — Z630 Problems in relationship with spouse or partner: Secondary | ICD-10-CM | POA: Diagnosis not present

## 2018-10-02 ENCOUNTER — Encounter: Payer: 59 | Admitting: Family Medicine

## 2018-10-12 ENCOUNTER — Encounter: Payer: Self-pay | Admitting: Internal Medicine

## 2018-10-16 DIAGNOSIS — J209 Acute bronchitis, unspecified: Secondary | ICD-10-CM | POA: Diagnosis not present

## 2018-10-23 DIAGNOSIS — J209 Acute bronchitis, unspecified: Secondary | ICD-10-CM | POA: Diagnosis not present

## 2018-10-23 DIAGNOSIS — J45909 Unspecified asthma, uncomplicated: Secondary | ICD-10-CM | POA: Diagnosis not present

## 2018-10-26 DIAGNOSIS — Z630 Problems in relationship with spouse or partner: Secondary | ICD-10-CM | POA: Diagnosis not present

## 2018-11-23 DIAGNOSIS — Z630 Problems in relationship with spouse or partner: Secondary | ICD-10-CM | POA: Diagnosis not present

## 2018-12-21 DIAGNOSIS — Z630 Problems in relationship with spouse or partner: Secondary | ICD-10-CM | POA: Diagnosis not present

## 2019-01-12 DIAGNOSIS — S61215A Laceration without foreign body of left ring finger without damage to nail, initial encounter: Secondary | ICD-10-CM | POA: Diagnosis not present

## 2019-01-18 DIAGNOSIS — Z630 Problems in relationship with spouse or partner: Secondary | ICD-10-CM | POA: Diagnosis not present

## 2019-02-05 DIAGNOSIS — I10 Essential (primary) hypertension: Secondary | ICD-10-CM | POA: Diagnosis not present

## 2021-04-02 ENCOUNTER — Ambulatory Visit
Admission: RE | Admit: 2021-04-02 | Discharge: 2021-04-02 | Disposition: A | Discharge status: Home / Self Care | Payer: 59 | Source: Ambulatory Visit | Attending: Home Modifications | Admitting: Home Modifications

## 2021-04-02 ENCOUNTER — Other Ambulatory Visit: Payer: Self-pay | Admitting: Home Modifications

## 2021-04-02 DIAGNOSIS — R109 Unspecified abdominal pain: Secondary | ICD-10-CM

## 2021-04-02 MED ORDER — IOPAMIDOL (ISOVUE-300) INJECTION 61%
100.0000 mL | Freq: Once | INTRAVENOUS | Status: AC | PRN
  Administered 2021-04-02: 100 mL via INTRAVENOUS

## 2021-08-12 ENCOUNTER — Encounter: Payer: Self-pay | Admitting: Internal Medicine

## 2021-10-15 ENCOUNTER — Ambulatory Visit (AMBULATORY_SURGERY_CENTER): Payer: 59 | Admitting: *Deleted

## 2021-10-15 ENCOUNTER — Encounter: Payer: Self-pay | Admitting: Internal Medicine

## 2021-10-15 ENCOUNTER — Other Ambulatory Visit: Payer: Self-pay

## 2021-10-15 VITALS — Ht 69.0 in | Wt 203.0 lb

## 2021-10-15 DIAGNOSIS — Z86010 Personal history of colonic polyps: Secondary | ICD-10-CM

## 2021-10-15 NOTE — Progress Notes (Signed)
Virtual pre visit completed over telephone. Instructions forwarded through MyChart and secure e-mail [email or web address]     No egg or soy allergy known to patient  No issues known to pt with past sedation with any surgeries or procedures Patient denies ever being told they had issues or difficulty with intubation  No FH of Malignant Hyperthermia Pt is not on diet pills Pt is not on  home 02  Pt is not on blood thinners  Pt denies issues with constipation  No A fib or A flutter     Due to the COVID-19 pandemic we are asking patients to follow certain guidelines in [place] and the [place]   Pt aware of COVID protocols and [facility] guidelines

## 2021-10-29 ENCOUNTER — Other Ambulatory Visit: Payer: Self-pay

## 2021-10-29 ENCOUNTER — Ambulatory Visit (AMBULATORY_SURGERY_CENTER): Payer: 59 | Admitting: Internal Medicine

## 2021-10-29 ENCOUNTER — Encounter: Payer: Self-pay | Admitting: Internal Medicine

## 2021-10-29 VITALS — BP 115/82 | HR 76 | Temp 97.8°F | Resp 16 | Ht 69.0 in | Wt 203.0 lb

## 2021-10-29 DIAGNOSIS — D123 Benign neoplasm of transverse colon: Secondary | ICD-10-CM

## 2021-10-29 DIAGNOSIS — D120 Benign neoplasm of cecum: Secondary | ICD-10-CM | POA: Diagnosis not present

## 2021-10-29 DIAGNOSIS — Z86010 Personal history of colonic polyps: Secondary | ICD-10-CM

## 2021-10-29 DIAGNOSIS — D125 Benign neoplasm of sigmoid colon: Secondary | ICD-10-CM

## 2021-10-29 MED ORDER — SODIUM CHLORIDE 0.9 % IV SOLN: 500.0000 mL | Freq: Once | INTRAVENOUS | Status: DC

## 2021-10-29 NOTE — Patient Instructions (Addendum)
I found and removed 6 tiny polyps.  You  have diverticulosis as you know- thickened muscle rings and pouches in the colon wall. Please read the handout about this condition.  I will let you know pathology results and when to have another routine colonoscopy by mail and/or My Chart.  I appreciate the opportunity to care for you. Gatha Mayer, MD, [facility]  Handouts provided on polyps and diverticulosis.   YOU HAD AN ENDOSCOPIC PROCEDURE TODAY AT [facility]:   Refer to the procedure report that was given to you for any specific questions about what was found during the examination.  If the procedure report does not answer your questions, please call your gastroenterologist to clarify.  If you requested that your care partner not be given the details of your procedure findings, then the procedure report has been included in a sealed envelope for you to review at your convenience later.  YOU SHOULD EXPECT: Some feelings of bloating in the abdomen. Passage of more gas than usual.  Walking can help get rid of the air that was put into your GI tract during the procedure and reduce the bloating. If you had a lower endoscopy (such as a colonoscopy or flexible sigmoidoscopy) you may notice spotting of blood in your stool or on the toilet paper. If you underwent a bowel prep for your procedure, you may not have a normal bowel movement for a few days.  Please Note:  You might notice some irritation and congestion in your nose or some drainage.  This is from the oxygen used during your procedure.  There is no need for concern and it should clear up in a day or so.  SYMPTOMS TO REPORT IMMEDIATELY:  Following lower endoscopy (colonoscopy or flexible sigmoidoscopy):  Excessive amounts of blood in the stool  Significant tenderness or worsening of abdominal pains  Swelling of the abdomen that is new, acute  Fever of 100F or higher  For urgent or emergent issues, a gastroenterologist can be  reached at any hour by calling [phone]. Do not use MyChart messaging for urgent concerns.    DIET:  We do recommend a small meal at first, but then you may proceed to your regular diet.  Drink plenty of fluids but you should avoid alcoholic beverages for 24 hours.  ACTIVITY:  You should plan to take it easy for the rest of today and you should NOT DRIVE or use heavy machinery until tomorrow (because of the sedation medicines used during the test).    FOLLOW UP: Our staff will call the number listed on your records 48-72 hours following your procedure to check on you and address any questions or concerns that you may have regarding the information given to you following your procedure. If we do not reach you, we will leave a message.  We will attempt to reach you two times.  During this call, we will ask if you have developed any symptoms of COVID 19. If you develop any symptoms (ie: fever, flu-like symptoms, shortness of breath, cough etc.) before then, please call [phone].  If you test positive for Covid 19 in the 2 weeks post procedure, please call and report this information to [place].    If any biopsies were taken you will be contacted by phone or by letter within the next 1-3 weeks.  Please call us at [phone] if you have not heard about the biopsies in 3 weeks.    SIGNATURES/CONFIDENTIALITY: You and/or  your care partner have signed paperwork which will be entered into your electronic medical record.  These signatures attest to the fact that that the information above on your After Visit Summary has been reviewed and is understood.  Full responsibility of the confidentiality of this discharge information lies with you and/or your care-partner.

## 2021-10-29 NOTE — Progress Notes (Signed)
Called to room to assist during endoscopic procedure.  Patient ID and intended procedure confirmed with present staff. Received instructions for my participation in the procedure from the performing physician.  

## 2021-10-29 NOTE — Progress Notes (Signed)
Pt's states no medical or surgical changes since previsit or office visit. VS assessed by C.W 

## 2021-10-29 NOTE — Op Note (Signed)
[place] Patient Name: Christopher Bolton Procedure Date: 10/29/2021 7:20 AM MRN: [id number] Endoscopist: Gatha Mayer , MD Age: 60 Referring MD:  Date of Birth: 04-04-1961 Gender: Male Account #: [id number] Procedure:                Colonoscopy Indications:              Surveillance: Personal history of adenomatous                            polyps on last colonoscopy > 5 years ago, Last                            colonoscopy: January 2016 Medicines:                Propofol per Anesthesia, Monitored Anesthesia Care Procedure:                Pre-Anesthesia Assessment:                           - Prior to the procedure, a History and Physical                            was performed, and patient medications and                            allergies were reviewed. The patient's tolerance of                            previous anesthesia was also reviewed. The risks                            and benefits of the procedure and the sedation                            options and risks were discussed with the patient.                            All questions were answered, and informed consent                            was obtained. Prior Anticoagulants: The patient has                            taken no previous anticoagulant or antiplatelet                            agents. ASA Grade Assessment: II - A patient with                            mild systemic disease. After reviewing the risks                            and benefits, the patient was deemed in  satisfactory condition to undergo the procedure.                           After obtaining informed consent, the colonoscope                            was passed under direct vision. Throughout the                            procedure, the patient's blood pressure, pulse, and                            oxygen saturations were monitored continuously. The                            CF [id number]  #[id number] was introduced through the anus                            and advanced to the the cecum, identified by                            appendiceal orifice and ileocecal valve. The                            colonoscopy was performed without difficulty. The                            patient tolerated the procedure well. The quality                            of the bowel preparation was good. The ileocecal                            valve, appendiceal orifice, and rectum were                            photographed. The bowel preparation used was                            Miralax via split dose instruction. Scope In: 8:11:17 AM Scope Out: 8:32:42 AM Scope Withdrawal Time: 0 hours 19 minutes 7 seconds  Total Procedure Duration: 0 hours 21 minutes 25 seconds  Findings:                 The perianal and digital rectal examinations were                            normal. Pertinent negatives include normal prostate                            (size, shape, and consistency).                           Five sessile polyps were found in the sigmoid  colon, transverse colon and cecum. The polyps were                            diminutive in size. These polyps were removed with                            a cold snare. Resection and retrieval were                            complete. Verification of patient identification                            for the specimen was done. Estimated blood loss was                            minimal.                           A 1 mm polyp was found in the cecum. The polyp was                            sessile. The polyp was removed with a cold biopsy                            forceps. Resection and retrieval were complete.                            Verification of patient identification for the                            specimen was done. Estimated blood loss was minimal.                           Multiple small and large-mouthed  diverticula were                            found in the sigmoid colon. There was narrowing of                            the colon in association with the diverticular                            opening.                           The exam was otherwise without abnormality on                            direct and retroflexion views. Complications:            No immediate complications. Estimated Blood Loss:     Estimated blood loss was minimal. Impression:               - Five diminutive polyps in the sigmoid colon, in  the transverse colon and in the cecum, removed with                            a cold snare. Resected and retrieved.                           - One 1 mm polyp in the cecum, removed with a cold                            biopsy forceps. Resected and retrieved.                           - Diverticulosis in the sigmoid colon. There was                            narrowing of the colon in association with the                            diverticular opening.                           - The examination was otherwise normal on direct                            and retroflexion views.                           - Personal history of colonic polyps. 4 adenomas                            max 10 mm 2016 Recommendation:           - Patient has a contact number available for                            emergencies. The signs and symptoms of potential                            delayed complications were discussed with the                            patient. Return to normal activities tomorrow.                            Written discharge instructions were provided to the                            patient.                           - Resume previous diet.                           - Continue present medications.                           -  Await pathology results.                           - Repeat colonoscopy is recommended for                             surveillance. The colonoscopy date will be                            determined after pathology results from today's                            exam become available for review. Gatha Mayer, MD 10/29/2021 8:42:51 AM This report has been signed electronically.

## 2021-10-29 NOTE — Progress Notes (Signed)
To PACU, VSS. Report to [email or web address] 

## 2021-10-29 NOTE — Progress Notes (Signed)
[facility] History and Physical   Primary Care Physician:  Marda Stalker, PA-C   Reason for Procedure:   Hx polyps  Plan:    colonoscopy     HPI: Cohen Boettner is a 60 y.o. male w/ hx adenomas in colon 2016. For surveillance exam. He had diverticulitis in spring 2022.   Past Medical History:  Diagnosis Date   Allergy    Asthma    Attention deficit disorder    Degenerative joint disease 2012   Dental crowns present    Depression    History of asthma    no current med.; is triggered by seasonal allergies   History of diverticulitis 2016   History of kidney stones    Hx of adenomatous colonic polyps 06/2015   Hypertension    states under control with med.([facility]); has been on med. x 3 yr.   Limited joint range of motion    cervical spine   Seasonal allergies    Trigger finger, left ring finger 06/2016    Past Surgical History:  Procedure Laterality Date   CERVICAL FUSION  2014   CHOLECYSTECTOMY     COLONOSCOPY WITH PROPOFOL  06/30/2015   CYSTOSCOPY W/ URETERAL STENT REMOVAL  10/27/2012   Procedure: CYSTOSCOPY WITH STENT REMOVAL;  Surgeon: [name], MD;  Location: [facility];  Service: Urology;  Laterality: Left;   CYSTOSCOPY WITH RETROGRADE PYELOGRAM, URETEROSCOPY AND STENT PLACEMENT  10/27/2012   Procedure: CYSTOSCOPY WITH RETROGRADE PYELOGRAM, URETEROSCOPY AND STENT PLACEMENT;  Surgeon: [name], MD;  Location: [facility];  Service: Urology;  Laterality: Left;   CYSTOSCOPY/RETROGRADE/URETEROSCOPY  10/08/2012   Procedure: CYSTOSCOPY/RETROGRADE/URETEROSCOPY;  Surgeon: [name], MD;  Location: [facility];  Service: Urology;  Laterality: Left;  stent    HOLMIUM LASER APPLICATION  85/88/5027   Procedure: HOLMIUM LASER APPLICATION;  Surgeon: [name], MD;  Location: [facility];  Service: Urology;  Laterality: Left;   PHOTOREFRACTIVE KERATOTOMY Left    TRIGGER FINGER RELEASE Right  02/22/2011   long finger   TRIGGER FINGER RELEASE Left 07/08/2016   Procedure: RELEASE TRIGGER FINGER/A-1 PULLEY left ring finger;  Surgeon: [name], MD;  Location: [place];  Service: Orthopedics;  Laterality: Left;  RELEASE TRIGGER FINGER/A-1 PULLEY left ring finger    Prior to Admission medications   Medication Sig Start Date End Date Taking? Authorizing Provider  amphetamine-dextroamphetamine (ADDERALL) 10 MG tablet Take 50 mg by mouth daily with breakfast.   Yes [provider]  buPROPion (WELLBUTRIN XL) 150 MG 24 hr tablet Take 450 mg by mouth daily.   Yes [provider]  levocetirizine (XYZAL) 5 MG tablet Take 1 tablet (5 mg total) by mouth every evening. 01/30/18  Yes Wendie Agreste, MD  losartan (COZAAR) 50 MG tablet Take 1 tablet (50 mg total) by mouth daily. 01/30/18  Yes Wendie Agreste, MD  albuterol (PROVENTIL HFA;VENTOLIN HFA) 108 (90 Base) MCG/ACT inhaler Inhale 1-2 puffs into the lungs every 6 (six) hours as needed for wheezing or shortness of breath. 01/30/17   Wendie Agreste, MD  albuterol (PROVENTIL) (2.5 MG/3ML) 0.083% nebulizer solution Take 3 mLs (2.5 mg total) by nebulization every 6 (six) hours as needed for wheezing or shortness of breath. 02/10/17   Jaynee Eagles, PA-C  cyclobenzaprine (FLEXERIL) 10 MG tablet 1 tablet as needed    [provider]  montelukast (SINGULAIR) 10 MG tablet Take 1 tablet (10 mg total) by mouth at bedtime. 01/30/18  Wendie Agreste, MD  pseudoephedrine (SUDAFED) 60 MG tablet Take 1 tablet (60 mg total) by mouth every 8 (eight) hours as needed. 02/07/17   Jaynee Eagles, PA-C  traMADol (ULTRAM) 50 MG tablet Take 1 tablet (50 mg total) by mouth every 8 (eight) hours as needed. 02/13/18   Wendie Agreste, MD    Current Outpatient Medications  Medication Sig Dispense Refill   amphetamine-dextroamphetamine (ADDERALL) 10 MG tablet Take 50 mg by mouth daily with breakfast.     buPROPion (WELLBUTRIN XL)  150 MG 24 hr tablet Take 450 mg by mouth daily.     levocetirizine (XYZAL) 5 MG tablet Take 1 tablet (5 mg total) by mouth every evening. 90 tablet 2   losartan (COZAAR) 50 MG tablet Take 1 tablet (50 mg total) by mouth daily. 90 tablet 2   albuterol (PROVENTIL HFA;VENTOLIN HFA) 108 (90 Base) MCG/ACT inhaler Inhale 1-2 puffs into the lungs every 6 (six) hours as needed for wheezing or shortness of breath. 1 Inhaler 0   albuterol (PROVENTIL) (2.5 MG/3ML) 0.083% nebulizer solution Take 3 mLs (2.5 mg total) by nebulization every 6 (six) hours as needed for wheezing or shortness of breath. 150 mL 1   cyclobenzaprine (FLEXERIL) 10 MG tablet 1 tablet as needed     montelukast (SINGULAIR) 10 MG tablet Take 1 tablet (10 mg total) by mouth at bedtime. 90 tablet 2   pseudoephedrine (SUDAFED) 60 MG tablet Take 1 tablet (60 mg total) by mouth every 8 (eight) hours as needed. 30 tablet 0   traMADol (ULTRAM) 50 MG tablet Take 1 tablet (50 mg total) by mouth every 8 (eight) hours as needed. 15 tablet 0   Current Facility-Administered Medications  Medication Dose Route Frequency Provider Last Rate Last Admin   0.9 %  sodium chloride infusion  500 mL Intravenous Once Gatha Mayer, MD        Allergies as of 10/29/2021 - Review Complete 10/29/2021  Allergen Reaction Noted   Apricot flavor Other (See Comments) 07/05/2016   Cantaloupe (diagnostic) Other (See Comments) 07/05/2016   Mango flavor Other (See Comments) 07/05/2016   Other Other (See Comments) 07/05/2016   Peach flavor Other (See Comments) 07/05/2016   Watermelon flavor [flavoring agent] Other (See Comments) 07/05/2016   Sulfamethoxazole-trimethoprim Other (See Comments) 10/05/2021   Ciprofloxacin Itching 04/30/2015    Family History  Problem Relation Age of Onset   Diabetes Mother    Hypertension Father    Hypertension Maternal Grandmother    Hypertension Maternal Grandfather    Alzheimer's disease Maternal Grandfather    Alzheimer's  disease Paternal Grandmother    Hypertension Paternal Grandmother    Diabetes Paternal Grandfather    Liver cancer Paternal Grandfather    Colon cancer Neg Hx    Colon polyps Neg Hx     Social History   Socioeconomic History   Marital status: Married    Spouse name: Not on file   Number of children: Not on file   Years of education: Not on file   Highest education level: Not on file  Occupational History   Occupation: system Printmaker: [employer]  Tobacco Use   Smoking status: Never   Smokeless tobacco: Never  Substance and Sexual Activity   Alcohol use: Yes    Alcohol/week: 4.0 standard drinks    Types: 4 Shots of liquor per week    Comment: weekly   Drug use: No   Sexual activity: Yes  Other Topics Concern  Not on file  Social History Narrative   He is married without children and 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  Physical Exam: Vital signs BP 140/79    Pulse 70    Temp 97.8 F (36.6 C) (Skin)    Ht 5\' 9"  (1.753 m)    Wt 203 lb (92.1 kg)    SpO2 98%    BMI 29.98 kg/m   General:   Alert,  Well-developed, well-nourished, pleasant and cooperative in NAD Lungs:  Clear throughout to auscultation.   Heart:  Regular rate and rhythm; no murmurs, clicks, rubs,  or gallops. Abdomen:  Soft, nontender and nondistended. Normal bowel sounds.   Neuro/Psych:  Alert and cooperative. Normal mood and affect. A and O x 3   @Tyniah Kastens  Simonne Maffucci, MD, [facility] [phone] (pager) 10/29/2021 8:04 AM@

## 2021-11-03 ENCOUNTER — Telehealth: Payer: Self-pay

## 2021-11-03 NOTE — Telephone Encounter (Signed)
°  Follow up Call-  Call back number 10/29/2021  Post procedure Call Back phone  # [phone]  Permission to leave phone message Yes  Some recent data might be hidden     Patient questions:  Do you have a fever, pain , or abdominal swelling? No. Pain Score  0 *  Have you tolerated food without any problems? Yes.    Have you been able to return to your normal activities? Yes.    Do you have any questions about your discharge instructions: Diet   No. Medications  No. Follow up visit  No.  Do you have questions or concerns about your Care? No.  Actions: * If pain score is 4 or above: No action needed, pain <4.  Have you developed a fever since your procedure? no  2.   Have you had an respiratory symptoms (SOB or cough) since your procedure? no  3.   Have you tested positive for COVID 19 since your procedure no  4.   Have you had any family members/close contacts diagnosed with the COVID 19 since your procedure?  no   If yes to any of these questions please route to Joylene John, RN and Joella Prince, RN

## 2021-11-09 ENCOUNTER — Encounter: Payer: Self-pay | Admitting: Internal Medicine
